# Patient Record
Sex: Female | Born: 1955 | Race: White | Hispanic: No | Marital: Married | Smoking: Never smoker
Health system: Southern US, Community
[De-identification: ages and names within clinical notes are randomized; demographics above are authoritative.]

## PROBLEM LIST (undated history)

## (undated) DIAGNOSIS — D649 Anemia, unspecified: Secondary | ICD-10-CM

## (undated) DIAGNOSIS — I10 Essential (primary) hypertension: Secondary | ICD-10-CM

## (undated) DIAGNOSIS — G2581 Restless legs syndrome: Principal | ICD-10-CM

## (undated) DIAGNOSIS — E538 Deficiency of other specified B group vitamins: Secondary | ICD-10-CM

## (undated) DIAGNOSIS — Z9884 Bariatric surgery status: Secondary | ICD-10-CM

## (undated) DIAGNOSIS — E669 Obesity, unspecified: Secondary | ICD-10-CM

## (undated) DIAGNOSIS — E7800 Pure hypercholesterolemia, unspecified: Secondary | ICD-10-CM

## (undated) DIAGNOSIS — F32A Depression, unspecified: Secondary | ICD-10-CM

## (undated) DIAGNOSIS — F329 Major depressive disorder, single episode, unspecified: Secondary | ICD-10-CM

## (undated) HISTORY — DX: Essential (primary) hypertension: I10

## (undated) HISTORY — DX: Major depressive disorder, single episode, unspecified: F32.9

## (undated) HISTORY — DX: Anemia, unspecified: D64.9

## (undated) HISTORY — DX: Depression, unspecified: F32.A

## (undated) HISTORY — DX: Obesity, unspecified: E66.9

## (undated) HISTORY — PX: ENDOMETRIAL ABLATION: SHX621

## (undated) HISTORY — DX: Pure hypercholesterolemia, unspecified: E78.00

## (undated) HISTORY — DX: Bariatric surgery status: Z98.84

## (undated) HISTORY — DX: Restless legs syndrome: G25.81

## (undated) HISTORY — DX: Deficiency of other specified B group vitamins: E53.8

---

## 1984-03-23 HISTORY — PX: CHOLECYSTECTOMY: SHX55

## 2000-09-06 ENCOUNTER — Encounter: Payer: Self-pay | Admitting: Obstetrics and Gynecology

## 2000-09-06 ENCOUNTER — Ambulatory Visit (HOSPITAL_COMMUNITY): Admission: RE | Admit: 2000-09-06 | Discharge: 2000-09-06 | Payer: Self-pay | Admitting: Obstetrics and Gynecology

## 2000-09-15 ENCOUNTER — Encounter: Payer: Self-pay | Admitting: Obstetrics and Gynecology

## 2000-09-15 ENCOUNTER — Ambulatory Visit (HOSPITAL_COMMUNITY): Admission: RE | Admit: 2000-09-15 | Discharge: 2000-09-15 | Payer: Self-pay | Admitting: *Deleted

## 2000-12-15 ENCOUNTER — Encounter: Payer: Self-pay | Admitting: Obstetrics and Gynecology

## 2000-12-15 ENCOUNTER — Ambulatory Visit (HOSPITAL_COMMUNITY): Admission: RE | Admit: 2000-12-15 | Discharge: 2000-12-15 | Payer: Self-pay | Admitting: Obstetrics and Gynecology

## 2001-10-19 ENCOUNTER — Ambulatory Visit (HOSPITAL_COMMUNITY): Admission: RE | Admit: 2001-10-19 | Discharge: 2001-10-19 | Payer: Self-pay | Admitting: Obstetrics & Gynecology

## 2001-10-19 ENCOUNTER — Encounter: Payer: Self-pay | Admitting: Obstetrics & Gynecology

## 2001-10-26 ENCOUNTER — Ambulatory Visit (HOSPITAL_COMMUNITY): Admission: RE | Admit: 2001-10-26 | Discharge: 2001-10-26 | Payer: Self-pay | Admitting: Obstetrics & Gynecology

## 2001-10-26 ENCOUNTER — Encounter: Payer: Self-pay | Admitting: Obstetrics & Gynecology

## 2002-11-10 ENCOUNTER — Ambulatory Visit (HOSPITAL_COMMUNITY): Admission: RE | Admit: 2002-11-10 | Discharge: 2002-11-10 | Payer: Self-pay | Admitting: Obstetrics & Gynecology

## 2002-11-10 ENCOUNTER — Encounter: Payer: Self-pay | Admitting: Obstetrics & Gynecology

## 2002-11-20 ENCOUNTER — Encounter: Payer: Self-pay | Admitting: Obstetrics & Gynecology

## 2002-11-20 ENCOUNTER — Ambulatory Visit (HOSPITAL_COMMUNITY): Admission: RE | Admit: 2002-11-20 | Discharge: 2002-11-20 | Payer: Self-pay | Admitting: Obstetrics & Gynecology

## 2003-10-03 ENCOUNTER — Other Ambulatory Visit: Admission: RE | Admit: 2003-10-03 | Discharge: 2003-10-03 | Payer: Self-pay | Admitting: Obstetrics and Gynecology

## 2003-12-04 ENCOUNTER — Ambulatory Visit (HOSPITAL_COMMUNITY): Admission: RE | Admit: 2003-12-04 | Discharge: 2003-12-04 | Payer: Self-pay | Admitting: Obstetrics & Gynecology

## 2004-01-09 ENCOUNTER — Encounter (INDEPENDENT_AMBULATORY_CARE_PROVIDER_SITE_OTHER): Payer: Self-pay | Admitting: Specialist

## 2004-01-10 ENCOUNTER — Ambulatory Visit (HOSPITAL_COMMUNITY): Admission: RE | Admit: 2004-01-10 | Discharge: 2004-01-10 | Payer: Self-pay | Admitting: Obstetrics & Gynecology

## 2004-05-29 ENCOUNTER — Ambulatory Visit: Payer: Self-pay | Admitting: Internal Medicine

## 2004-08-08 ENCOUNTER — Ambulatory Visit (HOSPITAL_COMMUNITY): Admission: RE | Admit: 2004-08-08 | Discharge: 2004-08-08 | Payer: Self-pay | Admitting: Family Medicine

## 2005-01-08 ENCOUNTER — Encounter: Admission: RE | Admit: 2005-01-08 | Discharge: 2005-01-08 | Payer: Self-pay | Admitting: Obstetrics & Gynecology

## 2005-01-20 ENCOUNTER — Other Ambulatory Visit: Admission: RE | Admit: 2005-01-20 | Discharge: 2005-01-20 | Payer: Self-pay | Admitting: Obstetrics & Gynecology

## 2005-04-01 ENCOUNTER — Ambulatory Visit (HOSPITAL_COMMUNITY): Admission: RE | Admit: 2005-04-01 | Discharge: 2005-04-01 | Payer: Self-pay | Admitting: Otolaryngology

## 2006-03-23 HISTORY — PX: INCONTINENCE SURGERY: SHX676

## 2006-04-01 ENCOUNTER — Ambulatory Visit (HOSPITAL_COMMUNITY): Admission: RE | Admit: 2006-04-01 | Discharge: 2006-04-01 | Payer: Self-pay | Admitting: Obstetrics & Gynecology

## 2006-07-01 ENCOUNTER — Ambulatory Visit (HOSPITAL_COMMUNITY): Admission: RE | Admit: 2006-07-01 | Discharge: 2006-07-02 | Payer: Self-pay | Admitting: Urology

## 2007-04-07 ENCOUNTER — Ambulatory Visit (HOSPITAL_COMMUNITY): Admission: RE | Admit: 2007-04-07 | Discharge: 2007-04-07 | Payer: Self-pay | Admitting: Obstetrics & Gynecology

## 2008-01-22 HISTORY — PX: GASTRIC BYPASS: SHX52

## 2008-04-18 ENCOUNTER — Ambulatory Visit (HOSPITAL_COMMUNITY): Admission: RE | Admit: 2008-04-18 | Discharge: 2008-04-18 | Payer: Self-pay | Admitting: Internal Medicine

## 2008-04-26 ENCOUNTER — Ambulatory Visit (HOSPITAL_COMMUNITY): Admission: RE | Admit: 2008-04-26 | Discharge: 2008-04-26 | Payer: Self-pay | Admitting: Family Medicine

## 2008-04-26 IMAGING — US US BREAST*R*
1 series · 4 of 4 positions shown · non-contrast
Comparison: Prior studies

CLINICAL DATA: Possible mass right breast identified on recent
screening mammogram.

DIGITAL DIAGNOSTIC  RIGHT  MAMMOGRAM  WITHOUT CAD AND RIGHT BREAST
ULTRASOUND:

[Series 1: us breast*right* · 0.07mm/px · 4 of 4 slices shown]
[im 1/4]
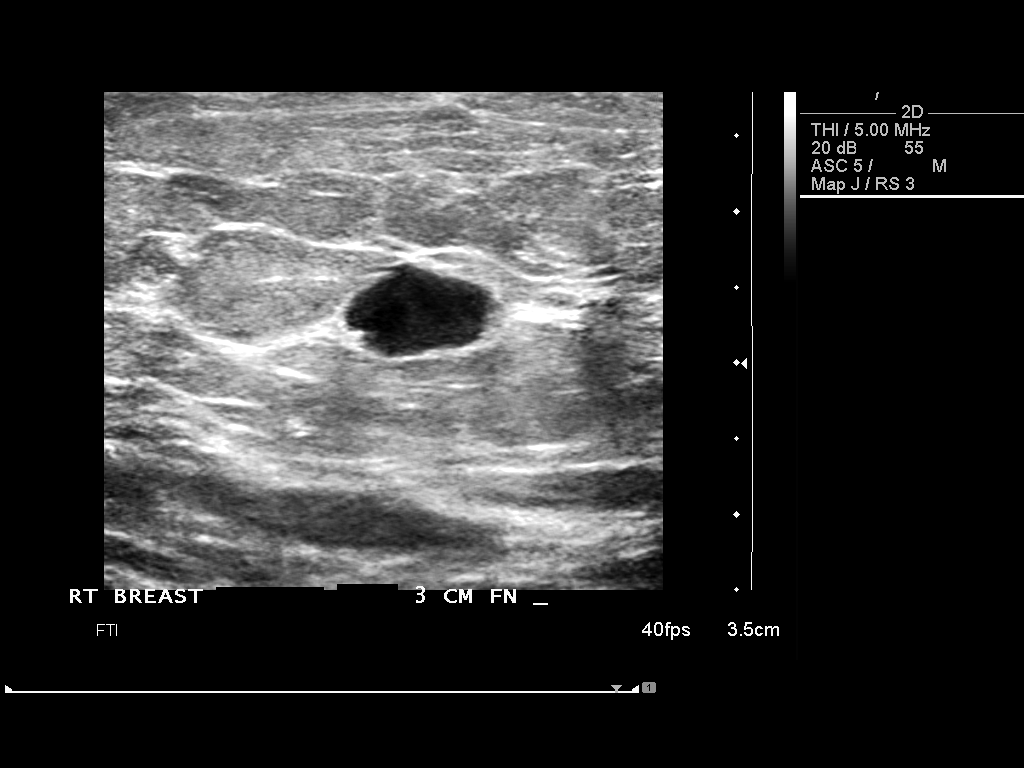
[im 2/4]
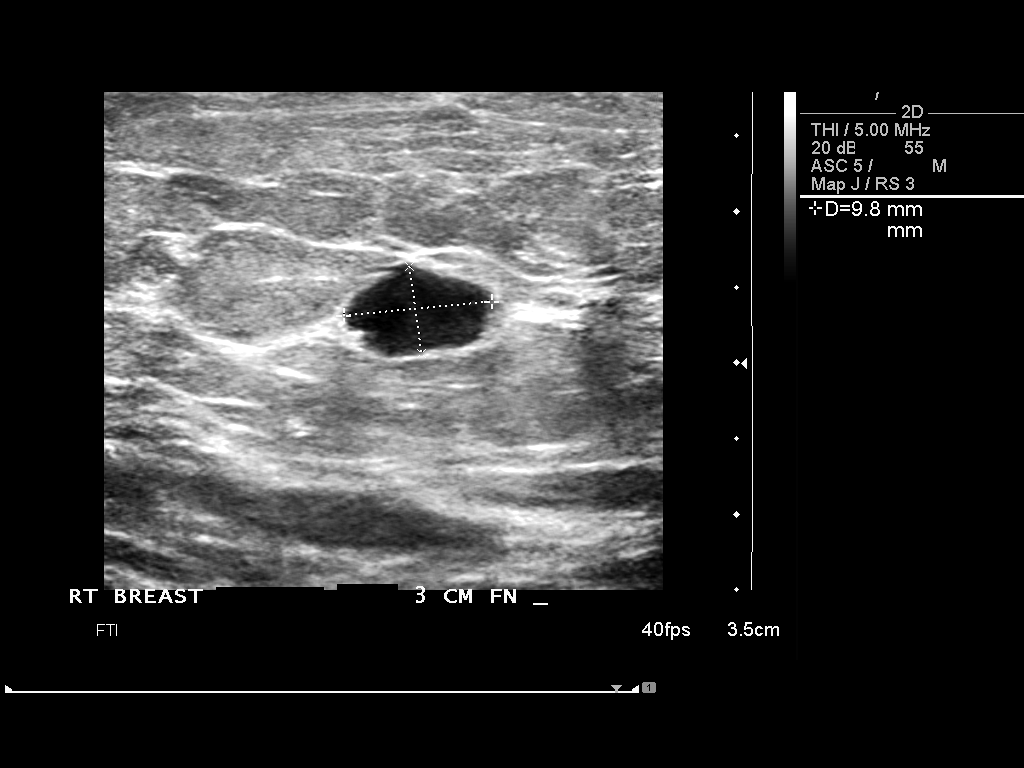
[im 3/4]
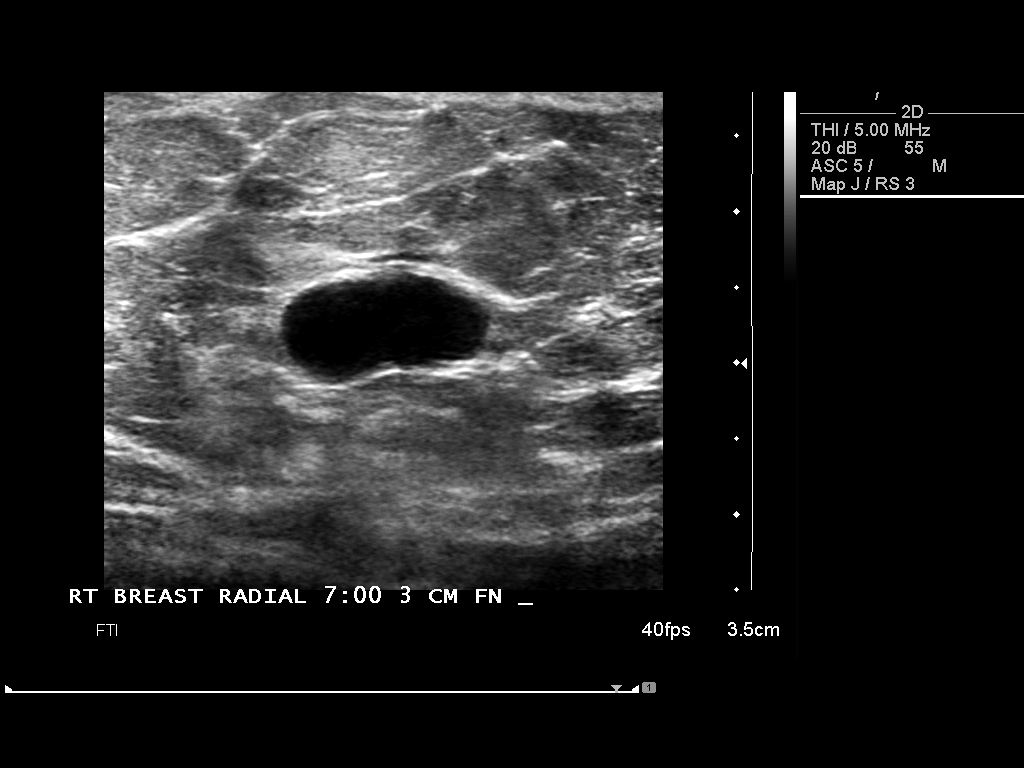
[im 4/4]
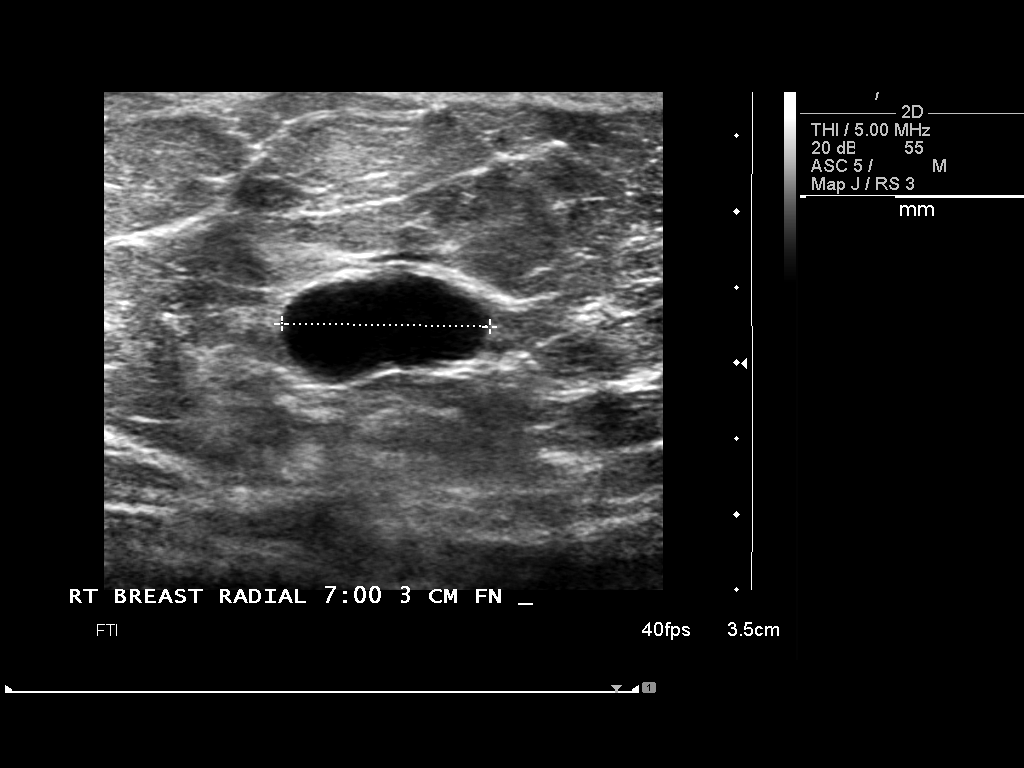

[4 of 4 positions shown; findings below may reference images not displayed]

FINDINGS: Focal spot compression views of the outer right breast,
near the level of the nipple confirm the presence of a
circumscribed oval 14 mm mass.

On physical exam, no mass is palpated in the right breast.

Ultrasound is performed, showing a circumscribed oval anechoic mass
at 7 o'clock position 3 cm from the nipple that measures 14 x 10 x
6 mm.  Imaging findings are consistent with a simple cyst.
IMPRESSION: 14 mm simple cyst 7 o'clock right breast accounts for the mass
identified on the patient's recent screening mammogram.  No
evidence of malignancy is identified in the right breast.
Screening mammogram of both breasts in 1 year is recommended.

BI-RADS CATEGORY 2:  Benign finding(s).

REF:A1 DICTATED: [DATE] [DATE]

## 2009-06-03 ENCOUNTER — Ambulatory Visit (HOSPITAL_COMMUNITY): Admission: RE | Admit: 2009-06-03 | Discharge: 2009-06-03 | Payer: Self-pay | Admitting: Obstetrics & Gynecology

## 2010-08-08 NOTE — Op Note (Signed)
Kimberly [name], [name]               ACCOUNT NO.:  [id number]   MEDICAL RECORD NO.:  [id number]          PATIENT TYPE:  OIB   LOCATION:  1427                         FACILITY:  [facility]   PHYSICIAN:  Sigmund I. Patsi Sears, M.D.DATE OF BIRTH:  1956-01-01   DATE OF PROCEDURE:  07/01/2006  DATE OF DISCHARGE:                               OPERATIVE REPORT   PREOPERATIVE DIAGNOSIS:  Stress urinary incontinence.   POSTOPERATIVE DIAGNOSIS:  Stress urinary incontinence.   PROCEDURE:  Placement of [employer] pubovaginal sling,  cystoscopy.   SURGEON:  Sigmund I. Patsi Sears, M.D.   RESIDENT:  Terie Purser, MD.   ANESTHESIA:  General.   COMPLICATIONS:  None.   BLOOD LOSS:  Minimal.   DRAINS:  16-French Foley catheter.   DISPOSITION:  Stable to post anesthesia care unit.   INDICATIONS FOR PROCEDURE:  Kimberly [name] is a 55 year old G2, P2 female  with a history of worsening stress urinary incontinence.  This was  worsened over the past year.  She currently has a greater than one pad  per day of incontinence and this is quite bothersome for her.  She has  been counseled regarding surgical options and she has agreed to proceed  with a pubovaginal sling after full discussion of benefits and risks.   DESCRIPTION OF PROCEDURE:  The patient was brought to the operating room  appropriately identified.  Time-out performed to confirm correct patient  and procedure.  She was administered general anesthesia given  preoperative antibiotics and placed in the dorsal lithotomy position and  prepped and draped sterile fashion.  We began by placing Foley catheter  and draining the bladder.  0.25% plain Marcaine was used to infiltrate  the anterior vaginal wall at the level of the urethra.  We then made a  approximately 2 cm incision over the mid urethra using a scalpel.  Dissection was carried out laterally through the periurethral tissue  towards the ipsilateral shoulder using the scissors.   Dissection was  carried out to the level of the pubic bone.  The retropubic space was  not entered.  When a adequate pocket was created on both sides of the  urethra, we proceeded with placement of our trocars.  With the bladder  empty, two stab incisions were made at the level of the pubic bone  approximately 2 cm lateral to the midline.  The trocar was placed  through the fascia and walked down the back of the pubic bone onto the  surgeon's finger that was placed in the incision.  The trocar was  delivered through the incision.  This was repeated on the contralateral  side.  The vagina was inspected to ensure that the trocar was not placed  through the corner of the vagina.   At this point we removed the Foley catheter and cystoscopy was  performed.  Indigo carmine had been given.  The bladder and urethra were  inspected with both the 12 and 70 degrees scope.  The urethra was normal  without evidence of perforation or injury.  Indigo carmine was seen  exiting from both ureteral orifices.  There was no cystoscopic evidence  of trocar injury or transmural placement of the trocar.  This was done  with the bladder filled specifically using the 70 degrees lens to  inspect the 11 and 1 o'clock positions.  There was no other evidence of  mucosal abnormality or evidence of bladder tumor noted.  This point the  cystoscope was removed.  Foley catheter was placed and the bladder was  drained.  The sling was then placed using the trocars and delivered.  Tensioning on the sling was done over a right-angle clamp to ensure a  tension-free placement.  The sling was then cut at the skin level.  Copious amounts of antibiotic solution were used.  The vagina was then  closed with a running 3-0 Vicryl suture.  The overlying stab incisions  were closed with Dermabond.  There was a small vaginal mucosal tear on  the right side of the vagina from retraction.  This was closed with a  figure-of-eight 4-0 PDS  suture.  Vaginal packing with Estrace cream was  placed x2.  The patient was then awoken from anesthesia and transported  to recovery room in stable condition.  There were no complications.  Please note Dr. Patsi Sears was present and participated in all aspects  of this procedure.     ______________________________  Terie Purser, MD      Sigmund I. Patsi Sears, M.D.  Electronically Signed    JH/MEDQ  D:  07/01/2006  T:  07/02/2006  Job:  [id number]

## 2010-08-08 NOTE — Op Note (Signed)
NAMESOPHEAP, BASIC               ACCOUNT NO.:  [id number]   MEDICAL RECORD NO.:  [id number]          PATIENT TYPE:  AMB   LOCATION:  SDC                           FACILITY:  [facility]   PHYSICIAN:  Freddy Finner, M.D.   DATE OF BIRTH:  02/08/1956   DATE OF PROCEDURE:  01/10/2004  DATE OF DISCHARGE:                                 OPERATIVE REPORT   PREOPERATIVE DIAGNOSES:  Uterine enlargement, menorrhagia.   POSTOPERATIVE DIAGNOSES:  Uterine enlargement, menorrhagia.   OPERATION/PROCEDURE:  Hysteroscopy, dilatation and curettage and Novasure  endometrial ablation.   SURGEON:  Dr. Jennette Kettle.   ANESTHESIA:  Monitored intravenous sedation, paracervical block.   INTRAOPERATIVE COMPLICATIONS:  None.   ESTIMATED INTRAOPERATIVE BLOOD LOSS:  50 cc or less.   LACTATED RINGER'S DEFICIT FROM PROCEDURE:  100 cc.   The patient was admitted on the morning of surgery.  She received an  antibiotic bolus preoperatively.  She was brought to the operating room,  placed under adequate intravenous sedation, placed in the dorsal lithotomy  position using the [place] stirrup system.  Betadine prep was carried out in  the usual fashion.  The bivalve speculum was introduced, the cervix  visualized, grasped with a single-toothed tenaculum and paracervical block  was then placed using 10 cc of 1% Xylocaine.  Injections were made at 4 and  8 o'clock in the vaginal fornices.  The cervical length was measured at 3  cm.  The uterus was sounded to 11 cm.  The cervix was dilated to 23 with  Pratt's.  The ICMI hysteroscope was placed using lactated Ringer's as the  distending medium.  Examination of the cavity revealed no apparent  abnormality except it was somewhat enlarged.  Gentle curettage was performed  to sample the endometrial tissue.  The Novasure device was applied.  Measurements not noted above were the cavity width of 4.8 cm.  The procedure  was performed in the standard fashion without difficulty.   Re-inspection was  carried out with the hysteroscope to visualize the endometrium.  Adequate  complete ablation was observed.  Photographs were made.  All instruments  were removed.  The patient was awakened and taken to recovery in good  condition.     Hosie Spangle   WRN/MEDQ  D:  01/10/2004  T:  01/10/2004  Job:  [id number]

## 2010-08-12 ENCOUNTER — Other Ambulatory Visit (HOSPITAL_COMMUNITY): Payer: Self-pay | Admitting: Obstetrics & Gynecology

## 2010-08-12 DIAGNOSIS — Z139 Encounter for screening, unspecified: Secondary | ICD-10-CM

## 2010-08-21 ENCOUNTER — Ambulatory Visit (HOSPITAL_COMMUNITY)
Admission: RE | Admit: 2010-08-21 | Discharge: 2010-08-21 | Disposition: A | Discharge status: Home / Self Care | Payer: BC Managed Care – PPO | Source: Ambulatory Visit | Attending: Obstetrics & Gynecology | Admitting: Obstetrics & Gynecology

## 2010-08-21 DIAGNOSIS — Z1231 Encounter for screening mammogram for malignant neoplasm of breast: Secondary | ICD-10-CM | POA: Insufficient documentation

## 2010-08-21 DIAGNOSIS — Z139 Encounter for screening, unspecified: Secondary | ICD-10-CM

## 2011-04-14 ENCOUNTER — Telehealth: Payer: Self-pay

## 2011-04-14 NOTE — Telephone Encounter (Signed)
LMOM to call.

## 2011-04-21 NOTE — Telephone Encounter (Signed)
Called and spoke with pt. She does have some anemia so I scheduled OV with

## 2011-04-21 NOTE — Telephone Encounter (Signed)
OV with Gerrit Halls, NP on 04/29/2011 @ 2:30 PM.

## 2011-04-29 ENCOUNTER — Encounter: Payer: Self-pay | Admitting: Gastroenterology

## 2011-04-29 ENCOUNTER — Ambulatory Visit (INDEPENDENT_AMBULATORY_CARE_PROVIDER_SITE_OTHER): Payer: Managed Care, Other (non HMO) | Admitting: Gastroenterology

## 2011-04-29 DIAGNOSIS — Z1211 Encounter for screening for malignant neoplasm of colon: Secondary | ICD-10-CM | POA: Insufficient documentation

## 2011-04-29 DIAGNOSIS — D649 Anemia, unspecified: Secondary | ICD-10-CM

## 2011-04-29 NOTE — Assessment & Plan Note (Signed)
Self-reported anemia with Hgb 11.1 recently at GYN office. No further labs to review. Will obtain any labs from GYN. Pt would rather wait until she sees bariatric surgeon for further labs. 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 She has no evidence of GI bleeding whatsoever. Discussed with patient possible further work-up in the future if indicated. She states understanding and would rather wait on further bloodwork today.

## 2011-04-29 NOTE — Patient Instructions (Signed)
We have set you up for a colonoscopy in the near future with Dr. Jena Gauss.  Due to possible anemia, we would like to have additional labs. When you go to see your surgeon, we would like to have copies of the labs. If possible, we could order these for you if you find out what is exactly needed. I would like to have a complete blood count, iron, and ferritin level on file. Most likely, this anemia is due to your gastric bypass. We will proceed with further work-up if necessary after the colonoscopy is completed.

## 2011-04-29 NOTE — Assessment & Plan Note (Signed)
56 year old female with need for initial screening colonoscopy. Overall, she has no lower GI symptoms of concern. Due to hx of gastric bypass, will modify prep by doing split dosing and hopefully using Prepopick if insurance costs are appropriate. Discussed ways to complete prep in timely manner without causing discomfort.   Proceed with TCS with Dr. Jena Gauss in near future: the risks, benefits, and alternatives have been discussed with the patient in detail. The patient states understanding and desires to proceed.

## 2011-04-29 NOTE — Progress Notes (Signed)
Referring Provider: Colette Ribas, MD Primary Care Physician:  Colette Ribas, MD, MD Primary Gastroenterologist:  Dr.  Chief Complaint  Patient presents with  . Colonoscopy    HPI:   Ms. Schachter is a pleasant 56 year old female who presents today at the request of Dr. Phillips Odor for a visit prior to initial screening colonoscopy. She has a history of a laparoscopic gastric bypass in 2009 in [place], [place]. She originally was in the 300 lb range, and she has managed to maintain around 100 lb wt loss overall. Prior comorbodities have since resolved since surgery. She states she has been told she was anemic last year during attempts to donate blood. Recently saw GYN who stated Hgb was 11.1 in office. Do not have current labs from GYN to review. Denies abdominal pain, N/V, dysphagia. No melena or hematochezia. Denies constipation. Only has diarrhea with sugary foods, consistent with dumping syndrome. She is due to see Dr. Clent Ridges, bariatric surgeon, in the near future. Comprehensive labs will be done at that time to include CBC, vitamin levels, etc.   At this time, she desires to wait on any further labs with [place] and complete this with the bariatric surgeon.   Past Medical History  Diagnosis Date  . Diabetes mellitus     resolved since gastric bypass in 2009  . Hypertension     resolved since gastric bypass in 2009  . Hypercholesterolemia     resolved since gastric bypass in 2009    Past Surgical History  Procedure Date  . Gastric bypass 01/2008  . Incontinence surgery 2008  . Cholecystectomy 1986  . Endometrial ablation     Current Outpatient Prescriptions  Medication Sig Dispense Refill  . aspirin 81 MG tablet Take 160 mg by mouth daily.      . Biotin 300 MCG TABS Take 3,000 mcg by mouth daily.      Marland Kitchen buPROPion (WELLBUTRIN XL) 300 MG 24 hr tablet Take 300 mg by mouth daily.      . citalopram (CELEXA) 40 MG tablet Take 40 mg by mouth daily.      . ferrous sulfate 325 (65 FE)  MG tablet Take 325 mg by mouth daily with breakfast.      . FIBER PO Take 500 mg by mouth daily.      . fish oil-omega-3 fatty acids 1000 MG capsule Take 2 g by mouth daily.      Marland Kitchen loratadine (CLARITIN) 10 MG tablet Take 10 mg by mouth daily.      . Multiple Vitamin (MULTIVITAMIN) capsule Take 1 capsule by mouth daily.      Marland Kitchen rOPINIRole (REQUIP) 0.5 MG tablet Take 0.5 mg by mouth 3 (three) times daily.      Marland Kitchen VITAMIN D, ERGOCALCIFEROL, PO Take 400 mg by mouth 2 (two) times daily.        Allergies as of 04/29/2011  . (No Known Allergies)    Family History  Problem Relation Age of Onset  . Colon cancer Neg Hx     History   Social History  . Marital Status: Married    Spouse Name: N/A    Number of Children: N/A  . Years of Education: N/A   Occupational History  .  [employer]    make cigarettes   Social History Main Topics  . Smoking status: Never Smoker   . Smokeless tobacco: Not on file  . Alcohol Use: Yes     2 time a month (wine)  .  Drug Use: No  . Sexually Active: Not on file   Other Topics Concern  . Not on file   Social History Narrative  . No narrative on file    Review of Systems: Gen: Denies any fever, chills, loss of appetite, fatigue, weight loss. CV: Denies chest pain, heart palpitations, syncope, peripheral edema. Resp: Denies shortness of breath with rest, cough, wheezing GI: Denies dysphagia or odynophagia. Denies hematemesis, fecal incontinence, or jaundice.  GU : Denies urinary burning, urinary frequency, urinary incontinence.  MS: Denies joint pain, muscle weakness, cramps, limited movement Derm: Denies rash, itching, dry skin Psych: Denies depression, anxiety, confusion or memory loss  Heme: Denies bruising, bleeding, and enlarged lymph nodes.  Physical Exam: BP 128/75  Pulse 75  Temp(Src) 97.8 F (36.6 C) (Temporal)  Ht 5\' 3"  (1.6 m)  Wt 203 lb (92.08 kg)  BMI 35.96 kg/m2 General:   Alert and oriented. Well-developed,  well-nourished, pleasant and cooperative. Head:  Normocephalic and atraumatic. Eyes:  Conjunctiva pink, sclera clear, no icterus.   Conjunctiva pink. Ears:  Normal auditory acuity. Nose:  No deformity, discharge,  or lesions. Mouth:  No deformity or lesions, mucosa pink and moist.  Neck:  Supple, without mass or thyromegaly. Lungs:  Clear to auscultation bilaterally, without wheezing, rales, or rhonchi.  Heart:  S1, S2 present without murmurs noted.  Abdomen:  +BS, soft, non-tender and non-distended. Without mass or HSM. No rebound or guarding. No hernias noted. Rectal:  Deferred  Msk:  Symmetrical without gross deformities. Normal posture. Pulses:  Normal pulses noted. Extremities:  Without clubbing or edema. Neurologic:  Alert and  oriented x4;  grossly normal neurologically. Skin:  Intact, warm and dry without significant lesions or rashes Cervical Nodes:  No significant cervical adenopathy. Psych:  Alert and cooperative. Normal mood and affect.

## 2011-04-30 NOTE — Progress Notes (Signed)
Faxed to PCP

## 2011-05-22 LAB — CBC
MCH: 25.8
MCV: 82.1 fL (ref 78–100)
RBC: 4.42
RDW: 16.1
platelet count: 282

## 2011-05-22 LAB — COMPREHENSIVE METABOLIC PANEL
Albumin: 4
BUN: 16 mg/dL (ref 4–21)
Bilirubin, Direct: 0.1 mg/dL (ref 0.01–0.4)
CO2: 23 mmol/L
Calcium: 8.8 mg/dL
Ferritin: 5 ng/mL — AB (ref 9.0–150.0)
Phosphorus: 4 mg/dL (ref 2.5–4.9)
Sodium: 141 mmol/L (ref 137–147)

## 2011-07-13 ENCOUNTER — Other Ambulatory Visit (HOSPITAL_COMMUNITY): Payer: Self-pay | Admitting: Internal Medicine

## 2011-07-13 ENCOUNTER — Ambulatory Visit (HOSPITAL_COMMUNITY)
Admission: RE | Admit: 2011-07-13 | Discharge: 2011-07-13 | Disposition: A | Discharge status: Home / Self Care | Payer: Managed Care, Other (non HMO) | Source: Ambulatory Visit | Attending: Internal Medicine | Admitting: Internal Medicine

## 2011-07-13 DIAGNOSIS — J069 Acute upper respiratory infection, unspecified: Secondary | ICD-10-CM

## 2011-07-13 DIAGNOSIS — J209 Acute bronchitis, unspecified: Secondary | ICD-10-CM | POA: Insufficient documentation

## 2011-09-01 ENCOUNTER — Other Ambulatory Visit (HOSPITAL_COMMUNITY): Payer: Self-pay | Admitting: Family Medicine

## 2011-09-01 DIAGNOSIS — Z139 Encounter for screening, unspecified: Secondary | ICD-10-CM

## 2011-09-07 ENCOUNTER — Ambulatory Visit (HOSPITAL_COMMUNITY)
Admission: RE | Admit: 2011-09-07 | Discharge: 2011-09-07 | Disposition: A | Discharge status: Home / Self Care | Payer: Managed Care, Other (non HMO) | Source: Ambulatory Visit | Attending: Family Medicine | Admitting: Family Medicine

## 2011-09-07 DIAGNOSIS — Z1231 Encounter for screening mammogram for malignant neoplasm of breast: Secondary | ICD-10-CM | POA: Insufficient documentation

## 2011-09-07 DIAGNOSIS — Z139 Encounter for screening, unspecified: Secondary | ICD-10-CM

## 2011-10-21 ENCOUNTER — Telehealth: Payer: Self-pay | Admitting: *Deleted

## 2011-10-21 NOTE — Telephone Encounter (Signed)
Pt is coming in for an office visit on Monday at 8:00 to see AS to set up her TCS

## 2011-10-21 NOTE — Telephone Encounter (Signed)
Kimberly [name] called today. She was seen several months ago and is wondering if it is time for her to have her colonoscopy. She would like a call back. Thanks.

## 2011-10-26 ENCOUNTER — Ambulatory Visit (INDEPENDENT_AMBULATORY_CARE_PROVIDER_SITE_OTHER): Payer: Managed Care, Other (non HMO) | Admitting: Gastroenterology

## 2011-10-26 ENCOUNTER — Encounter: Payer: Self-pay | Admitting: Gastroenterology

## 2011-10-26 ENCOUNTER — Other Ambulatory Visit: Payer: Self-pay | Admitting: Internal Medicine

## 2011-10-26 VITALS — BP 121/72 | HR 80 | Temp 98.2°F | Ht 63.0 in | Wt 216.2 lb

## 2011-10-26 DIAGNOSIS — Z1211 Encounter for screening for malignant neoplasm of colon: Secondary | ICD-10-CM

## 2011-10-26 DIAGNOSIS — D649 Anemia, unspecified: Secondary | ICD-10-CM

## 2011-10-26 MED ORDER — SOD PICOSULFATE-MAG OX-CIT ACD 10-3.5-12 MG-GM-GM PO PACK: 1.0000 | PACK | Freq: Once | ORAL | Status: DC

## 2011-10-26 NOTE — Patient Instructions (Addendum)
We have set you up for a colonoscopy with Dr. Rourk in the near future.  Further recommendations to follow once this is completed.  

## 2011-10-26 NOTE — Assessment & Plan Note (Signed)
Likely due to hx of malabsorption, s/p gastric bypass in 2009. Placed on iron. Obtain any labs from Dr. Clent Ridges, bariatric surgeon. No melena, hematochezia.

## 2011-10-26 NOTE — Progress Notes (Signed)
Referring Provider: Colette Ribas, MD Primary Care Physician:  Colette Ribas, MD Primary Gastroenterologist: Dr. Jena Gauss   Chief Complaint  Patient presents with  . Colonoscopy    HPI:   Kimberly [name] is a 56 year old female with need for initial screening colonoscopy. She was seen by myself in Feb 2013, but she held off on proceeding due to findings of anemia through her bariatric surgeon. She is s/p laparoscopic gastric bypass in 2009 by Dr. Clent Ridges in [place], [place]. Was pre-op in 300 lb range. Prior comorbodities have resolved.  Denies abdominal pain, N/V, dysphagia. No upper GI symptoms. Denies any rectal bleeding. No changes in bowel habits. No complaints today. Has been placed on iron since last seen.    Past Medical History  Diagnosis Date  . Diabetes mellitus     resolved since gastric bypass in 2009  . Hypertension     resolved since gastric bypass in 2009  . Hypercholesterolemia     resolved since gastric bypass in 2009    Past Surgical History  Procedure Date  . Gastric bypass 01/2008  . Incontinence surgery 2008  . Cholecystectomy 1986  . Endometrial ablation     Current Outpatient Prescriptions  Medication Sig Dispense Refill  . aspirin 81 MG tablet Take 160 mg by mouth daily.      . Biotin 300 MCG TABS Take 3,000 mcg by mouth daily.      Marland Kitchen buPROPion (WELLBUTRIN XL) 300 MG 24 hr tablet Take 300 mg by mouth daily.      . citalopram (CELEXA) 40 MG tablet Take 40 mg by mouth daily.      . ferrous sulfate 325 (65 FE) MG tablet Take 325 mg by mouth daily with breakfast.      . FIBER PO Take 500 mg by mouth daily.      . fish oil-omega-3 fatty acids 1000 MG capsule Take 2 g by mouth daily.      Marland Kitchen loratadine (CLARITIN) 10 MG tablet Take 10 mg by mouth daily.      . meloxicam (MOBIC) 15 MG tablet Take 15 mg by mouth daily.       . Multiple Vitamin (MULTIVITAMIN) capsule Take 1 capsule by mouth daily.      Marland Kitchen rOPINIRole (REQUIP) 0.5 MG tablet Take 0.5 mg by  mouth 3 (three) times daily.      Marland Kitchen VITAMIN D, ERGOCALCIFEROL, PO Take 400 mg by mouth 2 (two) times daily.      . Sod Picosulfate-Mag Ox-Cit Acd 10-3.5-12 MG-GM-GM PACK Take 1 Container by mouth once.  1 each  0    Allergies as of 10/26/2011  . (No Known Allergies)    Family History  Problem Relation Age of Onset  . Colon cancer Neg Hx     History   Social History  . Marital Status: Married    Spouse Name: N/A    Number of Children: N/A  . Years of Education: N/A   Occupational History  .  [employer]    make cigarettes   Social History Main Topics  . Smoking status: Never Smoker   . Smokeless tobacco: None  . Alcohol Use: Yes     2 time a month (wine)  . Drug Use: No  . Sexually Active: None   Other Topics Concern  . None   Social History Narrative  . None    Review of Systems: Gen: Denies fever, chills, anorexia. Denies fatigue, weakness, weight loss.  CV: Denies  chest pain, palpitations, syncope, peripheral edema, and claudication. Resp: Denies dyspnea at rest, cough, wheezing, coughing up blood, and pleurisy. GI: Denies vomiting blood, jaundice, and fecal incontinence.   Denies dysphagia or odynophagia. Derm: Denies rash, itching, dry skin Psych: Denies depression, anxiety, memory loss, confusion. No homicidal or suicidal ideation.  Heme: Denies bruising, bleeding, and enlarged lymph nodes.  Physical Exam: BP 121/72  Pulse 80  Temp 98.2 F (36.8 C) (Temporal)  Ht 5\' 3"  (1.6 m)  Wt 216 lb 3.2 oz (98.068 kg)  BMI 38.30 kg/m2 General:   Alert and oriented. No distress noted. Pleasant and cooperative.  Head:  Normocephalic and atraumatic. Eyes:  Conjuctiva clear without scleral icterus. Mouth:  Oral mucosa pink and moist. Good dentition. No lesions. Neck:  Supple, without mass or thyromegaly. Heart:  S1, S2 present without murmurs, rubs, or gallops. Regular rate and rhythm. Abdomen:  +BS, soft, non-tender and non-distended. No rebound or  guarding. No HSM or masses noted. Msk:  Symmetrical without gross deformities. Normal posture. Extremities:  Without edema. Neurologic:  Alert and  oriented x4;  grossly normal neurologically. Skin:  Intact without significant lesions or rashes. Cervical Nodes:  No significant cervical adenopathy. Psych:  Alert and cooperative. Normal mood and affect.

## 2011-10-26 NOTE — Assessment & Plan Note (Signed)
56 year old female with need for initial average risk screening colonoscopy. No lower or upper GI symptoms, no rectal bleeding.   Proceed with TCS with Dr. Jena Gauss in near future: the risks, benefits, and alternatives have been discussed with the patient in detail. The patient states understanding and desires to proceed. Due to pt's hx of gastric bypass, will order Prepopik for bowel prep. Discussed with pt how to take prep due to hx of procedure.

## 2011-10-26 NOTE — Progress Notes (Signed)
Faxed to PCP

## 2011-11-05 ENCOUNTER — Encounter (HOSPITAL_COMMUNITY): Payer: Self-pay | Admitting: Pharmacy Technician

## 2011-11-18 MED ORDER — SODIUM CHLORIDE 0.45 % IV SOLN
Freq: Once | INTRAVENOUS | Status: AC
  Administered 2011-11-19: 09:00:00 via INTRAVENOUS

## 2011-11-19 ENCOUNTER — Ambulatory Visit (HOSPITAL_COMMUNITY)
Admission: RE | Admit: 2011-11-19 | Discharge: 2011-11-19 | Disposition: A | Discharge status: Home / Self Care | Payer: Managed Care, Other (non HMO) | Source: Ambulatory Visit | Attending: Internal Medicine | Admitting: Internal Medicine

## 2011-11-19 ENCOUNTER — Encounter (HOSPITAL_COMMUNITY): Payer: Self-pay | Admitting: *Deleted

## 2011-11-19 ENCOUNTER — Encounter (HOSPITAL_COMMUNITY)
Admission: RE | Disposition: A | Discharge status: Home / Self Care | Payer: Self-pay | Source: Ambulatory Visit | Attending: Internal Medicine

## 2011-11-19 DIAGNOSIS — Z79899 Other long term (current) drug therapy: Secondary | ICD-10-CM | POA: Insufficient documentation

## 2011-11-19 DIAGNOSIS — Z1211 Encounter for screening for malignant neoplasm of colon: Secondary | ICD-10-CM

## 2011-11-19 DIAGNOSIS — E7800 Pure hypercholesterolemia, unspecified: Secondary | ICD-10-CM | POA: Insufficient documentation

## 2011-11-19 DIAGNOSIS — I10 Essential (primary) hypertension: Secondary | ICD-10-CM | POA: Insufficient documentation

## 2011-11-19 DIAGNOSIS — D126 Benign neoplasm of colon, unspecified: Secondary | ICD-10-CM | POA: Insufficient documentation

## 2011-11-19 DIAGNOSIS — E119 Type 2 diabetes mellitus without complications: Secondary | ICD-10-CM | POA: Insufficient documentation

## 2011-11-19 HISTORY — PX: COLONOSCOPY: SHX5424

## 2011-11-19 SURGERY — COLONOSCOPY
Anesthesia: Moderate Sedation

## 2011-11-19 MED ORDER — MIDAZOLAM HCL 5 MG/5ML IJ SOLN
INTRAMUSCULAR | Status: DC | PRN
  Administered 2011-11-19 (×2): 2 mg via INTRAVENOUS

## 2011-11-19 MED ORDER — MIDAZOLAM HCL 5 MG/5ML IJ SOLN
INTRAMUSCULAR | Status: AC
  Filled 2011-11-19: qty 10

## 2011-11-19 MED ORDER — MEPERIDINE HCL 100 MG/ML IJ SOLN
INTRAMUSCULAR | Status: AC
  Filled 2011-11-19: qty 2

## 2011-11-19 MED ORDER — MEPERIDINE HCL 100 MG/ML IJ SOLN
INTRAMUSCULAR | Status: DC | PRN
  Administered 2011-11-19 (×2): 50 mg via INTRAVENOUS

## 2011-11-19 MED ORDER — STERILE WATER FOR IRRIGATION IR SOLN
Status: DC | PRN
  Administered 2011-11-19: 09:00:00

## 2011-11-19 NOTE — Interval H&P Note (Signed)
History and Physical Interval Note:  11/19/2011 9:13 AM  Kimberly [name]  has presented today for surgery, with the diagnosis of SCREENING TCS  The various methods of treatment have been discussed with the patient and family. After consideration of risks, benefits and other options for treatment, the patient has consented to  Procedure(s) (LRB): COLONOSCOPY (N/A) as a surgical intervention .  The patient's history has been reviewed, patient examined, no change in status, stable for surgery.  I have reviewed the patient's chart and labs.  Questions were answered to the patient's satisfaction.     [name]

## 2011-11-19 NOTE — Op Note (Signed)
[facility] [address] [place], [address]   COLONOSCOPY PROCEDURE REPORT  PATIENT: Kimberly [name], Kimberly [name]  MR#:         [id number] BIRTHDATE: Feb 24, 1956 , 55  yrs. old GENDER: Female ENDOSCOPIST: R.  Roetta Sessions, MD FACP FACG REFERRED BY:  Assunta Found, M.D. PROCEDURE DATE:  11/19/2011 PROCEDURE:     colonoscopy with snare polypectomy  INDICATIONS: first-ever average risk screening colonoscopy  INFORMED CONSENT:  The risks, benefits, alternatives and imponderables including but not limited to bleeding, perforation as well as the possibility of a missed lesion have been reviewed.  The potential for biopsy, lesion removal, etc. have also been discussed.  Questions have been answered.  All parties agreeable. Please see the history and physical in the medical record for more information.  MEDICATIONS: Versed 4 mg IV and Demerol 100 mg IV in divided doses.  DESCRIPTION OF PROCEDURE:  After a digital rectal exam was performed, the EC-[id number] ([id number])  colonoscope was advanced from the anus through the rectum and colon to the area of the cecum, ileocecal valve and appendiceal orifice.  The cecum was deeply intubated.  These structures were well-seen and photographed for the record.  From the level of the cecum and ileocecal valve, the scope was slowly and cautiously withdrawn.  The mucosal surfaces were carefully surveyed utilizing scope tip deflection to facilitate fold flattening as needed.  The scope was pulled down into the rectum where a thorough examination including retroflexion was performed.    FINDINGS:  Marginal to poor prep. Normal rectum. Long redundant colon. 5 mm polyp at the splenic flexure; otherwise, the colon appeared grossly normal.  THERAPEUTIC / DIAGNOSTIC MANEUVERS PERFORMED:  the above-mentioned polyps cold snare removed.  COMPLICATIONS: none  CECAL WITHDRAWAL TIME:  27 minutes  IMPRESSION:  marginal prep. Colonic polyp-removed as  described above  RECOMMENDATIONS: Followup on pathology. Further recommendations to follow.   _______________________________ eSigned:  R. Roetta Sessions, MD FACP [facility] 11/19/2011 10:05 AM   CC:    PATIENT NAME:  Kimberly [name], Kimberly [name] MR#: [id number]

## 2011-11-19 NOTE — H&P (View-Only) (Signed)
 Referring Provider: Golding, John Cabot, MD Primary Care Physician:  GOLDING,JOHN CABOT, MD Primary Gastroenterologist: Dr. Rourk   Chief Complaint  Patient presents with  . Colonoscopy    HPI:   Kimberly [name] is a 55-year-old female with need for initial screening colonoscopy. She was seen by myself in Feb 2013, but she held off on proceeding due to findings of anemia through her bariatric surgeon. She is s/p laparoscopic gastric bypass in 2009 by Dr. Walsh in [place], [place]. Was pre-op in 300 lb range. Prior comorbodities have resolved.  Denies abdominal pain, N/V, dysphagia. No upper GI symptoms. Denies any rectal bleeding. No changes in bowel habits. No complaints today. Has been placed on iron since last seen.    Past Medical History  Diagnosis Date  . Diabetes mellitus     resolved since gastric bypass in 2009  . Hypertension     resolved since gastric bypass in 2009  . Hypercholesterolemia     resolved since gastric bypass in 2009    Past Surgical History  Procedure Date  . Gastric bypass 01/2008  . Incontinence surgery 2008  . Cholecystectomy 1986  . Endometrial ablation     Current Outpatient Prescriptions  Medication Sig Dispense Refill  . aspirin 81 MG tablet Take 160 mg by mouth daily.      . Biotin 300 MCG TABS Take 3,000 mcg by mouth daily.      . buPROPion (WELLBUTRIN XL) 300 MG 24 hr tablet Take 300 mg by mouth daily.      . citalopram (CELEXA) 40 MG tablet Take 40 mg by mouth daily.      . ferrous sulfate 325 (65 FE) MG tablet Take 325 mg by mouth daily with breakfast.      . FIBER PO Take 500 mg by mouth daily.      . fish oil-omega-3 fatty acids 1000 MG capsule Take 2 g by mouth daily.      . loratadine (CLARITIN) 10 MG tablet Take 10 mg by mouth daily.      . meloxicam (MOBIC) 15 MG tablet Take 15 mg by mouth daily.       . Multiple Vitamin (MULTIVITAMIN) capsule Take 1 capsule by mouth daily.      . rOPINIRole (REQUIP) 0.5 MG tablet Take 0.5 mg by  mouth 3 (three) times daily.      . VITAMIN D, ERGOCALCIFEROL, PO Take 400 mg by mouth 2 (two) times daily.      . Sod Picosulfate-Mag Ox-Cit Acd 10-3.5-12 MG-GM-GM PACK Take 1 Container by mouth once.  1 each  0    Allergies as of 10/26/2011  . (No Known Allergies)    Family History  Problem Relation Age of Onset  . Colon cancer Neg Hx     History   Social History  . Marital Status: Married    Spouse Name: N/A    Number of Children: N/A  . Years of Education: N/A   Occupational History  .  [employer]    make cigarettes   Social History Main Topics  . Smoking status: Never Smoker   . Smokeless tobacco: None  . Alcohol Use: Yes     2 time a month (wine)  . Drug Use: No  . Sexually Active: None   Other Topics Concern  . None   Social History Narrative  . None    Review of Systems: Gen: Denies fever, chills, anorexia. Denies fatigue, weakness, weight loss.  CV: Denies   chest pain, palpitations, syncope, peripheral edema, and claudication. Resp: Denies dyspnea at rest, cough, wheezing, coughing up blood, and pleurisy. GI: Denies vomiting blood, jaundice, and fecal incontinence.   Denies dysphagia or odynophagia. Derm: Denies rash, itching, dry skin Psych: Denies depression, anxiety, memory loss, confusion. No homicidal or suicidal ideation.  Heme: Denies bruising, bleeding, and enlarged lymph nodes.  Physical Exam: BP 121/72  Pulse 80  Temp 98.2 F (36.8 C) (Temporal)  Ht 5' 3" (1.6 m)  Wt 216 lb 3.2 oz (98.068 kg)  BMI 38.30 kg/m2 General:   Alert and oriented. No distress noted. Pleasant and cooperative.  Head:  Normocephalic and atraumatic. Eyes:  Conjuctiva clear without scleral icterus. Mouth:  Oral mucosa pink and moist. Good dentition. No lesions. Neck:  Supple, without mass or thyromegaly. Heart:  S1, S2 present without murmurs, rubs, or gallops. Regular rate and rhythm. Abdomen:  +BS, soft, non-tender and non-distended. No rebound or  guarding. No HSM or masses noted. Msk:  Symmetrical without gross deformities. Normal posture. Extremities:  Without edema. Neurologic:  Alert and  oriented x4;  grossly normal neurologically. Skin:  Intact without significant lesions or rashes. Cervical Nodes:  No significant cervical adenopathy. Psych:  Alert and cooperative. Normal mood and affect.  

## 2011-11-22 ENCOUNTER — Encounter: Payer: Self-pay | Admitting: Internal Medicine

## 2011-11-24 ENCOUNTER — Encounter: Payer: Self-pay | Admitting: *Deleted

## 2011-11-27 ENCOUNTER — Other Ambulatory Visit (HOSPITAL_COMMUNITY): Payer: Self-pay | Admitting: Family Medicine

## 2011-11-27 ENCOUNTER — Ambulatory Visit (HOSPITAL_COMMUNITY)
Admission: RE | Admit: 2011-11-27 | Discharge: 2011-11-27 | Disposition: A | Discharge status: Home / Self Care | Payer: Managed Care, Other (non HMO) | Source: Ambulatory Visit | Attending: Family Medicine | Admitting: Family Medicine

## 2011-11-27 DIAGNOSIS — M545 Low back pain, unspecified: Secondary | ICD-10-CM

## 2011-11-27 DIAGNOSIS — S338XXA Sprain of other parts of lumbar spine and pelvis, initial encounter: Secondary | ICD-10-CM

## 2011-11-27 DIAGNOSIS — M533 Sacrococcygeal disorders, not elsewhere classified: Secondary | ICD-10-CM | POA: Insufficient documentation

## 2011-11-30 ENCOUNTER — Encounter (HOSPITAL_COMMUNITY): Payer: Self-pay | Admitting: Internal Medicine

## 2011-12-14 NOTE — Progress Notes (Signed)
Received blood work from [employer], English as a foreign language dated march 2013.   Hgb 11.4 Ferritin 5 Alk Pho 153, otherwise normal LFTs  We need to get updated ferritin and iron, updated CBC, ifobt. Likely IDA secondary to malabsorption (s/p gastric bypass). TCS up-to-date.

## 2011-12-15 ENCOUNTER — Other Ambulatory Visit: Payer: Self-pay

## 2011-12-15 ENCOUNTER — Other Ambulatory Visit: Payer: Self-pay | Admitting: Gastroenterology

## 2011-12-15 DIAGNOSIS — D509 Iron deficiency anemia, unspecified: Secondary | ICD-10-CM

## 2011-12-15 NOTE — Progress Notes (Signed)
Mailed letter, ifobt and lab order to pt.

## 2011-12-21 ENCOUNTER — Encounter (INDEPENDENT_AMBULATORY_CARE_PROVIDER_SITE_OTHER): Payer: Managed Care, Other (non HMO) | Admitting: Ophthalmology

## 2011-12-21 DIAGNOSIS — H2510 Age-related nuclear cataract, unspecified eye: Secondary | ICD-10-CM

## 2011-12-21 DIAGNOSIS — H3540 Unspecified peripheral retinal degeneration: Secondary | ICD-10-CM

## 2011-12-21 DIAGNOSIS — H43819 Vitreous degeneration, unspecified eye: Secondary | ICD-10-CM

## 2012-02-16 ENCOUNTER — Ambulatory Visit (HOSPITAL_COMMUNITY)
Admission: RE | Admit: 2012-02-16 | Discharge: 2012-02-16 | Disposition: A | Discharge status: Home / Self Care | Payer: Managed Care, Other (non HMO) | Source: Ambulatory Visit | Attending: Family Medicine | Admitting: Family Medicine

## 2012-02-16 ENCOUNTER — Other Ambulatory Visit (HOSPITAL_COMMUNITY): Payer: Self-pay | Admitting: Family Medicine

## 2012-02-16 DIAGNOSIS — M25579 Pain in unspecified ankle and joints of unspecified foot: Secondary | ICD-10-CM

## 2012-03-31 ENCOUNTER — Encounter (INDEPENDENT_AMBULATORY_CARE_PROVIDER_SITE_OTHER): Payer: Managed Care, Other (non HMO) | Admitting: Ophthalmology

## 2012-03-31 DIAGNOSIS — H43819 Vitreous degeneration, unspecified eye: Secondary | ICD-10-CM

## 2012-03-31 DIAGNOSIS — H2510 Age-related nuclear cataract, unspecified eye: Secondary | ICD-10-CM

## 2012-09-08 ENCOUNTER — Other Ambulatory Visit (HOSPITAL_COMMUNITY): Payer: Self-pay | Admitting: Obstetrics and Gynecology

## 2012-09-08 DIAGNOSIS — Z139 Encounter for screening, unspecified: Secondary | ICD-10-CM

## 2012-09-13 ENCOUNTER — Ambulatory Visit (HOSPITAL_COMMUNITY): Payer: Managed Care, Other (non HMO)

## 2012-09-15 ENCOUNTER — Ambulatory Visit (HOSPITAL_COMMUNITY)
Admission: RE | Admit: 2012-09-15 | Discharge: 2012-09-15 | Disposition: A | Discharge status: Home / Self Care | Payer: Managed Care, Other (non HMO) | Source: Ambulatory Visit | Attending: Obstetrics and Gynecology | Admitting: Obstetrics and Gynecology

## 2012-09-15 DIAGNOSIS — Z1231 Encounter for screening mammogram for malignant neoplasm of breast: Secondary | ICD-10-CM | POA: Insufficient documentation

## 2012-09-15 DIAGNOSIS — Z139 Encounter for screening, unspecified: Secondary | ICD-10-CM

## 2013-11-09 ENCOUNTER — Other Ambulatory Visit (HOSPITAL_COMMUNITY): Payer: Self-pay | Admitting: Obstetrics & Gynecology

## 2013-11-09 DIAGNOSIS — Z1231 Encounter for screening mammogram for malignant neoplasm of breast: Secondary | ICD-10-CM

## 2013-11-13 ENCOUNTER — Ambulatory Visit (HOSPITAL_COMMUNITY): Payer: Managed Care, Other (non HMO)

## 2013-11-13 ENCOUNTER — Other Ambulatory Visit (HOSPITAL_COMMUNITY): Payer: Self-pay | Admitting: Physician Assistant

## 2013-11-13 DIAGNOSIS — R748 Abnormal levels of other serum enzymes: Secondary | ICD-10-CM

## 2013-11-15 ENCOUNTER — Ambulatory Visit (HOSPITAL_COMMUNITY)
Admission: RE | Admit: 2013-11-15 | Discharge: 2013-11-15 | Disposition: A | Discharge status: Home / Self Care | Payer: Managed Care, Other (non HMO) | Source: Ambulatory Visit | Attending: Physician Assistant | Admitting: Physician Assistant

## 2013-11-15 ENCOUNTER — Ambulatory Visit (HOSPITAL_COMMUNITY)
Admission: RE | Admit: 2013-11-15 | Discharge: 2013-11-15 | Disposition: A | Discharge status: Home / Self Care | Payer: Managed Care, Other (non HMO) | Source: Ambulatory Visit | Attending: Obstetrics & Gynecology | Admitting: Obstetrics & Gynecology

## 2013-11-15 DIAGNOSIS — Z1231 Encounter for screening mammogram for malignant neoplasm of breast: Secondary | ICD-10-CM | POA: Insufficient documentation

## 2013-11-15 DIAGNOSIS — K7689 Other specified diseases of liver: Secondary | ICD-10-CM | POA: Insufficient documentation

## 2013-11-15 DIAGNOSIS — R7989 Other specified abnormal findings of blood chemistry: Secondary | ICD-10-CM | POA: Insufficient documentation

## 2013-11-15 DIAGNOSIS — R160 Hepatomegaly, not elsewhere classified: Secondary | ICD-10-CM | POA: Insufficient documentation

## 2013-11-15 DIAGNOSIS — R748 Abnormal levels of other serum enzymes: Secondary | ICD-10-CM

## 2013-11-16 ENCOUNTER — Ambulatory Visit (HOSPITAL_COMMUNITY): Payer: Managed Care, Other (non HMO)

## 2013-11-20 ENCOUNTER — Other Ambulatory Visit: Payer: Self-pay | Admitting: Obstetrics and Gynecology

## 2013-11-22 LAB — CYTOLOGY - PAP

## 2014-01-19 ENCOUNTER — Encounter: Payer: Self-pay | Admitting: Nutrition

## 2014-01-19 ENCOUNTER — Encounter: Payer: Managed Care, Other (non HMO) | Attending: "Endocrinology | Admitting: Nutrition

## 2014-01-19 DIAGNOSIS — Z9884 Bariatric surgery status: Secondary | ICD-10-CM | POA: Insufficient documentation

## 2014-01-19 DIAGNOSIS — Z6841 Body Mass Index (BMI) 40.0 and over, adult: Secondary | ICD-10-CM | POA: Diagnosis not present

## 2014-01-19 DIAGNOSIS — Z713 Dietary counseling and surveillance: Secondary | ICD-10-CM | POA: Diagnosis not present

## 2014-01-19 DIAGNOSIS — E669 Obesity, unspecified: Secondary | ICD-10-CM | POA: Diagnosis not present

## 2014-01-19 NOTE — Patient Instructions (Addendum)
Plan:   1. Start using a walking tape for exercise. 2. Cut out soft drinks. 3. Plan better for meals and appropriate foods. 4. Increase water intake to 64 oz per day. 5. Increase protein with all meals and reduce carb intake of starchy vegetables . Increase protein to include PB, eggs, cheese or  [place] yogurt.     Try using protein powers in breakfast shakes 6. Switch dressing from [facility] to a vinaigrette 7. Lose 1 lb per week. 8.Keep a food journal.

## 2014-01-19 NOTE — Progress Notes (Signed)
  Medical Nutrition Therapy:  Appt start time: 1300 end time:  1400.   Assessment:  Primary concerns today: Obesity.Marland Kitchen  6 yrs ago gastric bypass in 2009. Lost 110 lbs 2009 and now is gaining some of it back.  Works full time. LIves with spouse. She does shopping and cooking.. Most foods are baked. Eats out once a week. Physical activity : golfs some but not weekly.  Has 56 yr old son recently moved back home. Has had a lot of stress with son in rehab and brother recently died of lung cancer who was her younger brother.  Admits to a lot of family stress and emotional eating.  Admits to not planning as she use to for her meals and physical activity.  Preferred Learning Style:     No preference indicated   Learning Readiness:   Ready  Change in progress   MEDICATIONS: Nutritional couseling for weight loss.   DIETARY INTAKE:  24-hr recall:  B ( AM): Fiber bar, 1/2 cam Sund[place] OR banana  Snk ( AM): Diet soda, apple L ( PM): Meat, vegetables, (homemade chicken noodle soup), Crackers 8-9 OR Grilled chicken sandwich with pasta salad. Sweet tea Snk ( PM): none D ( PM): Strip steak with toss salad-Ranch Dressing, 1/2 can cherry coke, m & m's, OR cheeseburger, fries and gingerale,  Snk ( PM) ice cream sandwich and hard pretzels.:  Beverages: Diet soda, regular sodas, water and sweet tea  Food journal reveals she is eating a lot of empty calorie sweets and beverages and salty snacks.   Usual physical activity: ADL's  Estimated energy needs: 1200- 1500 calories 170 g carbohydrates 112 g protein 42 g fat  Progress Towards Goal(s):  In progress.   Nutritional Diagnosis:  NB-1.1 Food and nutrition-related knowledge deficit As related to Obesity.  As evidenced by BMI > 30. and regaining 30-40 lbs from her gastric bypass.    Intervention:  Nutrition counseling for weight loss and improved health; s/p gastric bypass from 2009.Marland Kitchen  Plan:   1. Start using a DVD walking tape for exercise 30  minutes a day. 2. Cut out soft drinks. 3. Plan better for meals and appropriate low carb and high protein foods for meals. 4. Increase water intake to 64 oz per day. 5. Increase protein with all meals and reduce carb intake of starchy vegetables . 6. Switch dressing from [facility] to a vinaigrette 7. Lose 1 lb per week.   Teaching Method Utilized:  Visual Auditory Hands on  Handouts given during visit include:  3 months post gastric bypass handouts.  Meal Planning Card  Barriers to learning/adherence to lifestyle change: none  Demonstrated degree of understanding via:  Teach Back   Monitoring/Evaluation:  Dietary intake, exercise, meal planning, and body weight in 1 month.

## 2014-02-23 ENCOUNTER — Ambulatory Visit: Payer: Managed Care, Other (non HMO) | Admitting: Nutrition

## 2014-04-19 ENCOUNTER — Telehealth: Payer: Self-pay | Admitting: Neurology

## 2014-04-19 NOTE — Telephone Encounter (Signed)
I called pt because we had a cancellation on 05-02-14 so we resch from 05-18-14 notified the referring dr of change

## 2014-05-02 ENCOUNTER — Ambulatory Visit (INDEPENDENT_AMBULATORY_CARE_PROVIDER_SITE_OTHER): Payer: Managed Care, Other (non HMO) | Admitting: Neurology

## 2014-05-02 ENCOUNTER — Encounter: Payer: Self-pay | Admitting: Neurology

## 2014-05-02 VITALS — BP 130/80 | HR 68 | Ht 63.0 in | Wt 255.5 lb

## 2014-05-02 DIAGNOSIS — D509 Iron deficiency anemia, unspecified: Secondary | ICD-10-CM

## 2014-05-02 DIAGNOSIS — G2581 Restless legs syndrome: Secondary | ICD-10-CM

## 2014-05-02 HISTORY — DX: Restless legs syndrome: G25.81

## 2014-05-02 MED ORDER — ROPINIROLE HCL 2 MG PO TABS: ORAL_TABLET | ORAL | Status: DC

## 2014-05-02 NOTE — Patient Instructions (Addendum)
1.  Recommend taking ropinirole 1mg  at noon and 1mg  at 7pm.  If nighttime symptoms are worse at this dose, go back to taking 2mg  at 7pm.  We may consider adding extended release in the future 2.  Agree with hematology evaluation to optimize iron deficient anemia 3.  Please send me a MyChart message in 2 weeks with an update 4.  Return to clinic 75-months

## 2014-05-02 NOTE — Progress Notes (Signed)
[facility] Clinic Note - Initial Visit   Date: 05/02/2014   Kimberly [name] MRN: [id number] DOB: Mar 12, 1956   Dear Collene Mares, PA:   Thank you for your kind referral of Kimberly [name] for consultation of restless leg syndrome. Although her history is well known to you, please allow [place] to reiterate it for the purpose of our medical record. The patient was accompanied to the clinic by self.   History of Present Illness: Kimberly [name] is a 59 y.o. right-handed Caucasian female with history of diabetes mellitus, hypertension, and hyperlipidemia which all resolved since gastric bypass in 2009, iron deficient anemia, osteoarthritis, and depression presenting for evaluation of restless leg syndrome.    Starting in the early 2000s, she developed restless sensation of her legs especially at night.  She recalls being unable to get comfortable when she was riding for long distances or resting. She describes is as if "legs have claustrophobia".  It is always improved by standing or walking.  When she is traveling, she always tries to get an aisle seat.  She was started on ropinirole 1mg  which alleviated symptoms for many years.  In the summer of 2015, she started having worsening symptoms which would wake her up at night time but also daytime symptoms.  In August 2015, ropinirole was increased to 2mg  at bedtime which helps her night time RLS, but does not have any benefit with daytime symptoms.  She reports having mild nausea and sleepiness with the 2mg  dose, which she takes 7pm.  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   She has known history of iron deficient anemia with ferritin of 9 and has been taking iron supplements without any improvement, for which she is scheduled to see hematology next week.   Past Medical  History  Diagnosis Date  . Diabetes mellitus     resolved since gastric bypass in 2009  . Hypertension     resolved since gastric bypass in 2009  . Hypercholesterolemia     resolved since gastric bypass in 2009  . Obesity     Past Surgical History  Procedure Laterality Date  . Gastric bypass  01/2008  . Incontinence surgery  2008  . Cholecystectomy  1986  . Endometrial ablation    . Colonoscopy  11/19/2011    Procedure: COLONOSCOPY;  Surgeon: Daneil Dolin, MD;  Location: [facility];  Service: Endoscopy;  Laterality: N/A;  9:30     Medications:  Current Outpatient Prescriptions on File Prior to Visit  Medication Sig Dispense Refill  . acetaminophen (TYLENOL) 500 MG tablet Take 1,000 mg by mouth every 6 (six) hours as needed. For pain    . Biotin 5000 MCG TABS Take 5,000 mcg by mouth daily.    Marland Kitchen buPROPion (WELLBUTRIN XL) 300 MG 24 hr tablet Take 300 mg by mouth daily.    . Calcium Carbonate-Vit D-Min (CALCIUM 1200 PO) Take 2,400 mg by mouth 2 (two) times daily.    . celecoxib (CELEBREX) 200 MG capsule Take 200 mg by mouth 2 (two) times daily.    . Cholecalciferol (VITAMIN D3) 10000 UNITS capsule Take 10,000 Units by mouth 2 (two) times daily.    . citalopram (CELEXA) 40 MG tablet Take 40 mg by mouth daily.    Marland Kitchen FeFum-FePoly-FA-B Cmp-C-Biot (FOLIVANE-PLUS) CAPS Take  1 capsule by mouth daily.    Marland Kitchen loratadine (CLARITIN) 10 MG tablet Take 10 mg by mouth daily.    . Methylcellulose, Laxative, (FIBER THERAPY) 500 MG TABS Take 500 mg by mouth daily.    . Multiple Vitamin (MULTIVITAMIN WITH MINERALS) TABS Take 1 tablet by mouth daily.     No current facility-administered medications on file prior to visit.    Allergies:  Allergies  Allergen Reactions  . Kiwi Extract Swelling and Rash    Family History: Family History  Problem Relation Age of Onset  . Colon cancer Neg Hx     Social History: History   Social History  . Marital Status: Married    Spouse Name: N/A  .  Number of Children: N/A  . Years of Education: N/A   Occupational History  .  [employer]    make cigarettes   Social History Main Topics  . Smoking status: Never Smoker   . Smokeless tobacco: Not on file  . Alcohol Use: Yes     Comment: 2 time a month (wine)  . Drug Use: No  . Sexual Activity: Not on file   Other Topics Concern  . Not on file   Social History Narrative    Review of Systems:  CONSTITUTIONAL: No fevers, chills, night sweats, or weight loss.   EYES: No visual changes or eye pain ENT: No hearing changes.  No history of nose bleeds.   RESPIRATORY: No cough, wheezing and shortness of breath.   CARDIOVASCULAR: Negative for chest pain, and palpitations.   GI: Negative for abdominal discomfort, blood in stools or black stools.  No recent change in bowel habits.   GU:  No history of incontinence.   MUSCLOSKELETAL: No history of joint pain or swelling.  No myalgias.   SKIN: Negative for lesions, rash, and itching.   HEMATOLOGY/ONCOLOGY: Negative for prolonged bleeding, bruising easily, and swollen nodes.  No history of cancer.   ENDOCRINE: Negative for cold or heat intolerance, polydipsia or goiter.   PSYCH:  +depression or anxiety symptoms.   NEURO: As Above.   Vital Signs:  BP 130/80 mmHg  Pulse 68  Ht 5\' 3"  (1.6 m)  Wt 255 lb 8 oz (115.894 kg)  BMI 45.27 kg/m2  SpO2 99%   General Medical Exam:   General:  Well appearing, comfortable.   Eyes/ENT: see cranial nerve examination.   Neck: No masses appreciated.  Full range of motion without tenderness.  No carotid bruits. Respiratory:  Clear to auscultation, good air entry bilaterally.   Cardiac:  Regular rate and rhythm, no murmur.   Extremities:  No deformities, edema, or skin discoloration.  Skin:  No rashes or lesions.  Neurological Exam: MENTAL STATUS including orientation to time, place, person, recent and remote memory, attention span and concentration, language, and fund of knowledge is  normal.  Speech is not dysarthric.  CRANIAL NERVES: II:  No visual field defects.  Unremarkable fundi.   III-IV-VI: Pupils equal round and reactive to light.  Normal conjugate, extra-ocular eye movements in all directions of gaze.  No nystagmus.  No ptosis.   V:  Normal facial sensation.     VII:  Normal facial symmetry and movements.   VIII:  Normal hearing and vestibular function.   IX-X:  Normal palatal movement.   XI:  Normal shoulder shrug and head rotation.   XII:  Normal tongue strength and range of motion, no deviation or fasciculation.  MOTOR:  No atrophy, fasciculations or abnormal  movements.  No pronator drift.  Tone is normal.    Right Upper Extremity:    Left Upper Extremity:    Deltoid  5/5   Deltoid  5/5   Biceps  5/5   Biceps  5/5   Triceps  5/5   Triceps  5/5   Wrist extensors  5/5   Wrist extensors  5/5   Wrist flexors  5/5   Wrist flexors  5/5   Finger extensors  5/5   Finger extensors  5/5   Finger flexors  5/5   Finger flexors  5/5   Dorsal interossei  5/5   Dorsal interossei  5/5   Abductor pollicis  5/5   Abductor pollicis  5/5   Tone (Ashworth scale)  0  Tone (Ashworth scale)  0   Right Lower Extremity:    Left Lower Extremity:    Hip flexors  5/5   Hip flexors  5/5   Hip extensors  5/5   Hip extensors  5/5   Knee flexors  5/5   Knee flexors  5/5   Knee extensors  5/5   Knee extensors  5/5   Dorsiflexors  5/5   Dorsiflexors  5/5   Plantarflexors  5/5   Plantarflexors  5/5   Toe extensors  5/5   Toe extensors  5/5   Toe flexors  5/5   Toe flexors  5/5   Tone (Ashworth scale)  0  Tone (Ashworth scale)  0   MSRs:  Right                                                                 Left brachioradialis 2+  brachioradialis 2+  biceps 2+  biceps 2+  triceps 2+  triceps 2+  patellar 2+  patellar 2+  ankle jerk 2+  ankle jerk 2+  Hoffman no  Hoffman no  plantar response down  plantar response down   SENSORY:  Normal and symmetric perception of light  touch, pinprick, vibration, and proprioception.  Romberg's sign absent.   COORDINATION/GAIT: Normal finger-to- nose-finger and heel-to-shin.  Intact rapid alternating movements bilaterally.  Able to rise from a chair without using arms.  Gait narrow based and stable. Tandem and stressed gait intact.    IMPRESSION: Mrs. Solberg is a 59 year-old female with iron deficient anemia presenting for evaluation of restless leg syndrome.  She was well-controlled on ropinirole 1mg  for 15 years and then in the summer of 2015 started having worsening nighttime symptoms and new onset of daytime discomfort which is now interfering with her quality of life.  She had severe iron deficient anemia and despite taking iron supplements, her ferritin remains low.  I encouraged her to see hematology for evaluation since low ferritin levels will most definitely make RLS worse.    In the meantime, I will optimize her ropinirole to provide her greater relief.  Recommend taking ropinirole 1mg  at noon and 1mg  at 7pm.  If nighttime symptoms are worse at this dose, she should go back to taking 2mg  at 7pm.   Going forward, may consider switching to extended release formulation which may help with her nighttime nausea  Instructed patient to send me a MyChart message in 2 weeks with an update  I will see  her back in 63-months   The duration of this appointment visit was 45 minutes of face-to-face time with the patient.  Greater than 50% of this time was spent in counseling, explanation of diagnosis, planning of further management, and coordination of care.   Thank you for allowing me to participate in patient's care.  If I can answer any additional questions, I would be pleased to do so.    Sincerely,    Donika K. Posey Pronto, DO

## 2014-05-02 NOTE — Progress Notes (Signed)
Note faxed.

## 2014-05-07 ENCOUNTER — Encounter (HOSPITAL_BASED_OUTPATIENT_CLINIC_OR_DEPARTMENT_OTHER): Payer: Managed Care, Other (non HMO)

## 2014-05-07 ENCOUNTER — Encounter (HOSPITAL_COMMUNITY): Payer: Self-pay | Admitting: Hematology & Oncology

## 2014-05-07 ENCOUNTER — Encounter (HOSPITAL_COMMUNITY): Payer: Managed Care, Other (non HMO) | Attending: Hematology & Oncology | Admitting: Hematology & Oncology

## 2014-05-07 VITALS — BP 130/61 | HR 71 | Temp 98.4°F | Resp 18 | Ht 63.0 in | Wt 258.9 lb

## 2014-05-07 DIAGNOSIS — Z9884 Bariatric surgery status: Secondary | ICD-10-CM

## 2014-05-07 DIAGNOSIS — D509 Iron deficiency anemia, unspecified: Secondary | ICD-10-CM | POA: Insufficient documentation

## 2014-05-07 MED ORDER — SODIUM CHLORIDE 0.9 % IV SOLN
510.0000 mg | Freq: Once | INTRAVENOUS | Status: AC
  Administered 2014-05-07: 510 mg via INTRAVENOUS
  Filled 2014-05-07: qty 17

## 2014-05-07 MED ORDER — SODIUM CHLORIDE 0.9 % IV SOLN
Freq: Once | INTRAVENOUS | Status: AC
  Administered 2014-05-07: 14:00:00 via INTRAVENOUS

## 2014-05-07 NOTE — Patient Instructions (Signed)
..  [place] at [facility] Discharge Instructions  RECOMMENDATIONS MADE BY THE CONSULTANT AND ANY TEST RESULTS WILL BE SENT TO YOUR REFERRING PHYSICIAN.  We checked your b12 level today and we will give you 6 doses of feraheme (one a week) We will see you back in 1 month with labs and to see the Dr.   Lujean Rave you for choosing [place] at [facility] to provide your oncology and hematology care.  To afford each patient quality time with our provider, please arrive at least 15 minutes before your scheduled appointment time.    You need to re-schedule your appointment should you arrive 10 or more minutes late.  We strive to give you quality time with our providers, and arriving late affects you and other patients whose appointments are after yours.  Also, if you no show three or more times for appointments you may be dismissed from the clinic at the providers discretion.     Again, thank you for choosing [facility].  Our hope is that these requests will decrease the amount of time that you wait before being seen by our physicians.       _____________________________________________________________  Should you have questions after your visit to [facility], please contact our office at [phone] between the hours of 8:30 a.m. and 4:30 p.m.  Voicemails left after 4:30 p.m. will not be returned until the following business day.  For prescription refill requests, have your pharmacy contact our office.

## 2014-05-07 NOTE — Progress Notes (Signed)
Tolerated iron infusion well. 

## 2014-05-07 NOTE — Progress Notes (Signed)
[facility] CONSULT NOTE  Patient Care Team: Sharilyn Sites, MD as PCP - General (Family Medicine) Daneil Dolin, MD (Gastroenterology)  CHIEF COMPLAINTS/PURPOSE OF CONSULTATION:  Iron deficiency anemia Gastric bypass  Colonoscopy 10/2011 with colon polyp, marginal prep  HISTORY OF PRESENTING ILLNESS:  Kimberly [name] 59 y.o. female is here because of iron deficiency anemia. She reports developing restless leg syndrome several years ago. She has noted that since the fall of 2015 her symptoms have significantly worsened. She wakes up a lot during the night and now her legs bother her during the day. She had a gastric bypass in 2009. She notes that after the procedure she still donated blood for several years. 2 years ago she was advised she could no longer donate blood because of anemia.  After her restless leg syndrome worsened she went to [place] for evaluation and states she was told she was iron deficient. She was started on iron tablets, but in January her iron level was unchanged. 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  Laboratory studies from 04/11/2014 show a hemoglobin of 9.6, MCV of 73, RDW elevated at 18.8, serum ferritin of 6 ng/ml, and iron saturation at 7%.   MEDICAL HISTORY:  Past Medical History  Diagnosis Date  . Diabetes mellitus     resolved since gastric bypass in 2009  . Hypertension     resolved since gastric bypass in 2009  . Hypercholesterolemia     resolved since gastric bypass in 2009  . Obesity   . Depression   . RLS (restless legs syndrome) 05/02/2014  . Anemia     SURGICAL HISTORY: Past Surgical History  Procedure Laterality Date  . Gastric bypass  01/2008  . Incontinence surgery  2008  . Cholecystectomy  1986  . Endometrial ablation    . Colonoscopy  11/19/2011    Procedure: COLONOSCOPY;  Surgeon: Daneil Dolin, MD;  [facility];  Service: Endoscopy;  Laterality: N/A;  9:30    SOCIAL  HISTORY: History   Social History  . Marital Status: Married    Spouse Name: N/A  . Number of Children: N/A  . Years of Education: N/A   Occupational History  .  [employer]    make cigarettes   Social History Main Topics  . Smoking status: Never Smoker   . Smokeless tobacco: Not on file  . Alcohol Use: 0.0 oz/week    0 Standard drinks or equivalent per week     Comment: 2 time a month (wine)  . Drug Use: No  . Sexual Activity: Not on file   Other Topics Concern  . Not on file   Social History Narrative   Lives with husband in a 2 story home.  Has 2 sons.     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     Education: MBA    FAMILY HISTORY: Family History  Problem Relation Age of Onset  . Colon cancer Neg Hx   . Restless legs syndrome Father   . Seizures Father     due to meningiomas  . Myasthenia gravis Father     Living  . Lung cancer Brother     Deceased, 49  . Healthy Sister   . Healthy Son     x2   indicated that her mother is alive. She indicated that her father is alive. She indicated that her sister is alive.  She indicated that her brother is deceased.   Brother died at the age of 32 from lung cancer he was a smoker. Her parents are in their late 42s and described as healthy.  ALLERGIES:  is allergic to kiwi extract.  MEDICATIONS:  Current Outpatient Prescriptions  Medication Sig Dispense Refill  . acetaminophen (TYLENOL) 500 MG tablet Take 1,000 mg by mouth every 6 (six) hours as needed. For pain    . Biotin 5000 MCG TABS Take 5,000 mcg by mouth daily.    Marland Kitchen buPROPion (WELLBUTRIN XL) 300 MG 24 hr tablet Take 300 mg by mouth daily.    . Calcium Carbonate-Vit D-Min (CALCIUM 1200 PO) Take 2,400 mg by mouth 2 (two) times daily.    . celecoxib (CELEBREX) 200 MG capsule Take 200 mg by mouth daily.     . Cholecalciferol (VITAMIN D3) 10000 UNITS capsule Take 10,000 Units by mouth 2 (two) times daily.    .  citalopram (CELEXA) 40 MG tablet Take 40 mg by mouth daily.    Marland Kitchen FeFum-FePoly-FA-B Cmp-C-Biot (FOLIVANE-PLUS) CAPS Take 1 capsule by mouth daily.    . Multiple Vitamin (MULTIVITAMIN WITH MINERALS) TABS Take 1 tablet by mouth daily.    Marland Kitchen rOPINIRole (REQUIP) 2 MG tablet Take 0.5 tab at noon and 1 tab at 7pm. 135 tablet 3  . loratadine (CLARITIN) 10 MG tablet Take 10 mg by mouth daily.    . Methylcellulose, Laxative, (FIBER THERAPY) 500 MG TABS Take 500 mg by mouth daily.     No current facility-administered medications for this visit.    Review of Systems  Constitutional: Positive for malaise/fatigue.  HENT:       Notes history of migraine  Eyes: Negative.   Respiratory: Negative.   Cardiovascular: Negative.   Gastrointestinal: Negative.   Genitourinary: Negative.   Musculoskeletal: Positive for joint pain.  Skin: Negative.   Neurological: Positive for headaches.       Severe RLS  Endo/Heme/Allergies: Negative.   Psychiatric/Behavioral: The patient has insomnia.     PHYSICAL EXAMINATION: ECOG PERFORMANCE STATUS: 1 - Symptomatic but completely ambulatory  Filed Vitals:   05/07/14 1500  BP: 130/61  Pulse: 71  Temp:   Resp: 18   Filed Weights   05/07/14 1317  Weight: 258 lb 14.4 oz (117.436 kg)     Physical Exam  Constitutional: She is oriented to person, place, and time and well-developed, well-nourished, and in no distress.  obese  HENT:  Head: Normocephalic and atraumatic.  Nose: Nose normal.  Mouth/Throat: Oropharynx is clear and moist. No oropharyngeal exudate.  Eyes: Conjunctivae and EOM are normal. Pupils are equal, round, and reactive to light. Right eye exhibits no discharge. Left eye exhibits no discharge. No scleral icterus.  Neck: Normal range of motion. Neck supple. No tracheal deviation present. No thyromegaly present.  Cardiovascular: Normal rate, regular rhythm and normal heart sounds.  Exam reveals no gallop and no friction rub.   No murmur  heard. Pulmonary/Chest: Effort normal and breath sounds normal. She has no wheezes. She has no rales.  Abdominal: Soft. Bowel sounds are normal. She exhibits no distension and no mass. There is no tenderness. There is no rebound and no guarding.  Musculoskeletal: Normal range of motion. She exhibits no edema.  Lymphadenopathy:    She has no cervical adenopathy.  Neurological: She is alert and oriented to person, place, and time. She has normal reflexes. No cranial nerve deficit. Gait normal. Coordination normal.  Skin: Skin is warm and dry. No  rash noted.  Psychiatric: Mood, memory, affect and judgment normal.  Nursing note and vitals reviewed.    LABORATORY DATA:  I have reviewed the data as listed Lab Results  Component Value Date   WBC 5.2 05/22/2011   HGB 11.4* 05/22/2011   HCT 36 05/22/2011     Chemistry      Component Value Date/Time   NA 141 05/22/2011 1655   K 4.1 05/22/2011 1655   CO2 23 05/22/2011 1655   BUN 16 05/22/2011 1655   CREATININE 0.58 05/22/2011 1655      Component Value Date/Time   CALCIUM 8.8 05/22/2011 1655   ALKPHOS 153 05/22/2011 1655   AST 22 05/22/2011 1655   ALT 18 05/22/2011 1655   BILITOT 0.3 05/22/2011 1655        ASSESSMENT & PLAN:  Iron deficiency anemia Pleasant 59 year old female with iron deficiency anemia. 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 We have calculated her total iron deficit today and we will set her up to begin weekly IV Feraheme. I will see her back in 4 weeks to review her CBC and iron levels.  I advised her that her initial iron placement replacement will take several weeks and the initial iron she receives will go towards making blood, then additional iron will go towards restoring her iron stores. I have advised her that if her blood counts do not completely normalize after her iron stores are  replete we will look at other causes of anemia. We may need to consider a GI evaluation as well. Her last colonoscopy was in 2013 but a marginal prep was noted. Given her history of gastric bypass I have also recommended adding a B12 level to her next set of labs.   We discussed some of the risks, benefits, and alternatives of intravenous iron infusions. The patient is symptomatic from anemia and the iron level is critically low. Some of the side-effects to be expected including risks of infusion reactions, phlebitis, headaches, nausea and fatigue.  The patient is willing to proceed. Goal is to keep ferritin level greater than or equal to 100 ng/ml      Orders Placed This Encounter  Procedures  . Vitamin B12  . CBC with Differential    Standing Status: Future     Number of Occurrences:      Standing Expiration Date: 05/08/2015  . Reticulocytes    Standing Status: Future     Number of Occurrences:      Standing Expiration Date: 05/08/2015  . Ferritin    Standing Status: Future     Number of Occurrences:      Standing Expiration Date: 05/08/2015  . Iron and TIBC    Standing Status: Future     Number of Occurrences:      Standing Expiration Date: 05/08/2015    All questions were answered. The patient knows to call the clinic with any problems, questions or concerns. This note was electronically signed.    Molli Hazard, MD MD 05/08/2014 8:08 AM

## 2014-05-08 LAB — VITAMIN B12: Vitamin B-12: 676 pg/mL (ref 211–911)

## 2014-05-08 NOTE — Assessment & Plan Note (Signed)
Pleasant 59 year old female with iron deficiency anemia. 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 We have calculated her total iron deficit today and we will set her up to begin weekly IV Feraheme. I will see her back in 4 weeks to review her CBC and iron levels.  I advised her that her initial iron placement replacement will take several weeks and the initial iron she receives will go towards making blood, then additional iron will go towards restoring her iron stores. I have advised her that if her blood counts do not completely normalize after her iron stores are replete we will look at other causes of anemia. We may need to consider a GI evaluation as well. Her last colonoscopy was in 2013 but a marginal prep was noted. Given her history of gastric bypass I have also recommended adding a B12 level to her next set of labs.   We discussed some of the risks, benefits, and alternatives of intravenous iron infusions. The patient is symptomatic from anemia and the iron level is critically low. Some of the side-effects to be expected including risks of infusion reactions, phlebitis, headaches, nausea and fatigue.  The patient is willing to proceed. Goal is to keep ferritin level greater than or equal to 100 ng/ml

## 2014-05-14 ENCOUNTER — Encounter (HOSPITAL_BASED_OUTPATIENT_CLINIC_OR_DEPARTMENT_OTHER): Payer: Managed Care, Other (non HMO)

## 2014-05-14 DIAGNOSIS — D509 Iron deficiency anemia, unspecified: Secondary | ICD-10-CM

## 2014-05-14 MED ORDER — SODIUM CHLORIDE 0.9 % IV SOLN
Freq: Once | INTRAVENOUS | Status: AC
  Administered 2014-05-14: 15:00:00 via INTRAVENOUS

## 2014-05-14 MED ORDER — SODIUM CHLORIDE 0.9 % IJ SOLN
10.0000 mL | Freq: Once | INTRAMUSCULAR | Status: AC
  Administered 2014-05-14: 10 mL via INTRAVENOUS

## 2014-05-14 MED ORDER — SODIUM CHLORIDE 0.9 % IV SOLN
510.0000 mg | Freq: Once | INTRAVENOUS | Status: AC
  Administered 2014-05-14: 510 mg via INTRAVENOUS
  Filled 2014-05-14: qty 17

## 2014-05-14 NOTE — Progress Notes (Signed)
Tolerated iron infusion well. 

## 2014-05-14 NOTE — Patient Instructions (Signed)
[facility] at [facility] Discharge Instructions  RECOMMENDATIONS MADE BY THE CONSULTANT AND ANY TEST RESULTS WILL BE SENT TO YOUR REFERRING PHYSICIAN.  Iron infusion today as ordered. Return as scheduled.  Thank you for choosing [facility] at [facility] to provide your oncology and hematology care.  To afford each patient quality time with our provider, please arrive at least 15 minutes before your scheduled appointment time.    You need to re-schedule your appointment should you arrive 10 or more minutes late.  We strive to give you quality time with our providers, and arriving late affects you and other patients whose appointments are after yours.  Also, if you no show three or more times for appointments you may be dismissed from the clinic at the providers discretion.     Again, thank you for choosing [facility].  Our hope is that these requests will decrease the amount of time that you wait before being seen by our physicians.       _____________________________________________________________  Should you have questions after your visit to [facility], please contact our office at [phone] between the hours of 8:30 a.m. and 4:30 p.m.  Voicemails left after 4:30 p.m. will not be returned until the following business day.  For prescription refill requests, have your pharmacy contact our office.    

## 2014-05-16 ENCOUNTER — Encounter: Payer: Self-pay | Admitting: Neurology

## 2014-05-17 ENCOUNTER — Other Ambulatory Visit (HOSPITAL_COMMUNITY): Payer: Self-pay | Admitting: "Endocrinology

## 2014-05-17 DIAGNOSIS — E210 Primary hyperparathyroidism: Secondary | ICD-10-CM

## 2014-05-18 ENCOUNTER — Ambulatory Visit: Payer: Managed Care, Other (non HMO) | Admitting: Neurology

## 2014-05-21 ENCOUNTER — Encounter (HOSPITAL_COMMUNITY): Payer: Managed Care, Other (non HMO)

## 2014-05-22 ENCOUNTER — Encounter (HOSPITAL_COMMUNITY): Payer: Managed Care, Other (non HMO)

## 2014-05-22 ENCOUNTER — Ambulatory Visit (HOSPITAL_COMMUNITY): Payer: Managed Care, Other (non HMO)

## 2014-05-24 ENCOUNTER — Encounter (HOSPITAL_COMMUNITY): Payer: Self-pay

## 2014-05-24 ENCOUNTER — Encounter (HOSPITAL_COMMUNITY): Payer: Managed Care, Other (non HMO) | Attending: Hematology & Oncology

## 2014-05-24 DIAGNOSIS — Z9884 Bariatric surgery status: Secondary | ICD-10-CM

## 2014-05-24 DIAGNOSIS — D509 Iron deficiency anemia, unspecified: Secondary | ICD-10-CM | POA: Insufficient documentation

## 2014-05-24 MED ORDER — SODIUM CHLORIDE 0.9 % IV SOLN
Freq: Once | INTRAVENOUS | Status: AC
  Administered 2014-05-24: 15:00:00 via INTRAVENOUS

## 2014-05-24 MED ORDER — SODIUM CHLORIDE 0.9 % IV SOLN
510.0000 mg | Freq: Once | INTRAVENOUS | Status: AC
  Administered 2014-05-24: 510 mg via INTRAVENOUS
  Filled 2014-05-24: qty 17

## 2014-05-24 NOTE — Progress Notes (Signed)
Gwynneth Aliment Tolerated iron infusion well today.  Discharged ambulatory

## 2014-05-24 NOTE — Patient Instructions (Signed)
[facility] at [facility]  Discharge Instructions:  You had an iron infusion today. Follow up as scheduled Call the clinic if you have any questions or concerns _______________________________________________________________  Thank you for choosing [facility] at [facility] to provide your oncology and hematology care.  To afford each patient quality time with our providers, please arrive at least 15 minutes before your scheduled appointment.  You need to re-schedule your appointment if you arrive 10 or more minutes late.  We strive to give you quality time with our providers, and arriving late affects you and other patients whose appointments are after yours.  Also, if you no show three or more times for appointments you may be dismissed from the clinic.  Again, thank you for choosing [facility]. Our hope is that these requests will allow you access to exceptional care and in a timely manner. _______________________________________________________________  If you have questions after your visit, please contact our office at [phone] between the hours of 8:30 a.m. and 5:00 p.m. Voicemails left after 4:30 p.m. will not be returned until the following business day. _______________________________________________________________  For prescription refill requests, have your pharmacy contact our office. _______________________________________________________________  Recommendations made by the consultant and any test results will be sent to your referring physician. _______________________________________________________________ 

## 2014-05-28 ENCOUNTER — Encounter (HOSPITAL_COMMUNITY): Payer: Managed Care, Other (non HMO)

## 2014-05-30 ENCOUNTER — Encounter (HOSPITAL_COMMUNITY): Payer: Managed Care, Other (non HMO) | Attending: Hematology & Oncology

## 2014-05-30 ENCOUNTER — Encounter (HOSPITAL_COMMUNITY): Payer: Self-pay

## 2014-05-30 DIAGNOSIS — D509 Iron deficiency anemia, unspecified: Secondary | ICD-10-CM

## 2014-05-30 MED ORDER — SODIUM CHLORIDE 0.9 % IV SOLN
Freq: Once | INTRAVENOUS | Status: AC
  Administered 2014-05-30: 500 mL via INTRAVENOUS

## 2014-05-30 MED ORDER — SODIUM CHLORIDE 0.9 % IV SOLN
510.0000 mg | Freq: Once | INTRAVENOUS | Status: AC
  Administered 2014-05-30: 510 mg via INTRAVENOUS
  Filled 2014-05-30: qty 17

## 2014-05-30 NOTE — Progress Notes (Signed)
Tolerated iv iron well

## 2014-05-30 NOTE — Patient Instructions (Signed)
[place] at [facility] Discharge Instructions  RECOMMENDATIONS MADE BY THE CONSULTANT AND ANY TEST RESULTS WILL BE SENT TO YOUR REFERRING PHYSICIAN.  You got IV Feraheme today and call for any concerns. Will see you back at you next appointment for labs and infusion.  Thank you for choosing [place] at [facility] to provide your oncology and hematology care.  To afford each patient quality time with our provider, please arrive at least 15 minutes before your scheduled appointment time.    You need to re-schedule your appointment should you arrive 10 or more minutes late.  We strive to give you quality time with our providers, and arriving late affects you and other patients whose appointments are after yours.  Also, if you no show three or more times for appointments you may be dismissed from the clinic at the providers discretion.     Again, thank you for choosing [facility].  Our hope is that these requests will decrease the amount of time that you wait before being seen by our physicians.       _____________________________________________________________  Should you have questions after your visit to [facility], please contact our office at [phone] between the hours of 8:30 a.m. and 4:30 p.m.  Voicemails left after 4:30 p.m. will not be returned until the following business day.  For prescription refill requests, have your pharmacy contact our office.

## 2014-05-31 ENCOUNTER — Encounter (HOSPITAL_COMMUNITY): Payer: Self-pay

## 2014-05-31 ENCOUNTER — Encounter (HOSPITAL_COMMUNITY)
Admission: RE | Admit: 2014-05-31 | Discharge: 2014-05-31 | Disposition: A | Discharge status: Home / Self Care | Payer: Managed Care, Other (non HMO) | Source: Ambulatory Visit | Attending: "Endocrinology | Admitting: "Endocrinology

## 2014-05-31 DIAGNOSIS — E213 Hyperparathyroidism, unspecified: Secondary | ICD-10-CM | POA: Insufficient documentation

## 2014-05-31 DIAGNOSIS — R946 Abnormal results of thyroid function studies: Secondary | ICD-10-CM | POA: Insufficient documentation

## 2014-05-31 DIAGNOSIS — E210 Primary hyperparathyroidism: Secondary | ICD-10-CM

## 2014-05-31 MED ORDER — TECHNETIUM TC 99M SESTAMIBI GENERIC - CARDIOLITE
25.0000 | Freq: Once | INTRAVENOUS | Status: AC | PRN
  Administered 2014-05-31: 25 via INTRAVENOUS

## 2014-06-04 ENCOUNTER — Encounter (HOSPITAL_COMMUNITY): Payer: Self-pay | Admitting: Oncology

## 2014-06-04 ENCOUNTER — Encounter (HOSPITAL_COMMUNITY): Payer: Managed Care, Other (non HMO) | Attending: Oncology | Admitting: Oncology

## 2014-06-04 ENCOUNTER — Encounter (HOSPITAL_COMMUNITY): Payer: Managed Care, Other (non HMO) | Attending: Hematology & Oncology

## 2014-06-04 ENCOUNTER — Encounter (HOSPITAL_COMMUNITY): Payer: Managed Care, Other (non HMO)

## 2014-06-04 VITALS — BP 124/60 | HR 75 | Temp 98.9°F | Resp 18 | Wt 253.0 lb

## 2014-06-04 DIAGNOSIS — K909 Intestinal malabsorption, unspecified: Secondary | ICD-10-CM

## 2014-06-04 DIAGNOSIS — Z9884 Bariatric surgery status: Secondary | ICD-10-CM

## 2014-06-04 DIAGNOSIS — D509 Iron deficiency anemia, unspecified: Secondary | ICD-10-CM

## 2014-06-04 HISTORY — DX: Bariatric surgery status: Z98.84

## 2014-06-04 MED ORDER — SODIUM CHLORIDE 0.9 % IV SOLN
INTRAVENOUS | Status: DC
  Administered 2014-06-04: 14:00:00 via INTRAVENOUS

## 2014-06-04 MED ORDER — SODIUM CHLORIDE 0.9 % IV SOLN
510.0000 mg | Freq: Once | INTRAVENOUS | Status: AC
  Administered 2014-06-04: 510 mg via INTRAVENOUS
  Filled 2014-06-04: qty 17

## 2014-06-04 MED ORDER — SODIUM CHLORIDE 0.9 % IJ SOLN
10.0000 mL | Freq: Once | INTRAMUSCULAR | Status: AC
  Administered 2014-06-04: 10 mL via INTRAVENOUS

## 2014-06-04 NOTE — Assessment & Plan Note (Addendum)
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  S/P 4 weekly IV Feraheme 510 mg infusions as dictated above and she is receiving #5 currently.  Will cancel #6 next week since she is much improved symptomatically.  Return in 4 weeks for repeat labs and follow-up.

## 2014-06-04 NOTE — Assessment & Plan Note (Addendum)
Will add copper level B12 level to lab testing in 4 weeks.

## 2014-06-04 NOTE — Progress Notes (Signed)
Tolerated iron infusion well. 

## 2014-06-04 NOTE — Patient Instructions (Signed)
[place] at [facility] Discharge Instructions  RECOMMENDATIONS MADE BY THE CONSULTANT AND ANY TEST RESULTS WILL BE SENT TO YOUR REFERRING PHYSICIAN.  Hold iron infusion next week. Return in 4 weeks for lab work and office visit.  Thank you for choosing [place] at [facility] to provide your oncology and hematology care.  To afford each patient quality time with our provider, please arrive at least 15 minutes before your scheduled appointment time.    You need to re-schedule your appointment should you arrive 10 or more minutes late.  We strive to give you quality time with our providers, and arriving late affects you and other patients whose appointments are after yours.  Also, if you no show three or more times for appointments you may be dismissed from the clinic at the providers discretion.     Again, thank you for choosing [facility].  Our hope is that these requests will decrease the amount of time that you wait before being seen by our physicians.       _____________________________________________________________  Should you have questions after your visit to [facility], please contact our office at [phone] between the hours of 8:30 a.m. and 4:30 p.m.  Voicemails left after 4:30 p.m. will not be returned until the following business day.  For prescription refill requests, have your pharmacy contact our office.

## 2014-06-04 NOTE — Progress Notes (Signed)
Kimberly Kilts, MD [place] [id number]  Iron deficiency anemia - Plan: CBC with Differential, Ferritin, Iron and TIBC  H/O gastric bypass - Plan: Copper, serum, Vitamin D 25 hydroxy, Copper, serum, Vitamin D 25 hydroxy, Copper, serum  CURRENT THERAPY: S/P weekly IV Feraheme 510 mg x 4 (05/07/2014- 05/30/2014).  INTERVAL HISTORY: Kimberly [name] 59 y.o. 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     I personally reviewed and went over laboratory results with the patient.  The results are noted within this dictation.  She is getting IV Feraheme today for #5.  I will not give her #6 next week to avoid overshooting her ferritin level.    Symptomatically, the patient reports that her restless leg syndrome is significantly improved and actually resolved.  She is please about that.  Hematologically, she otherwise denies any complaints and ROS questioning is negative.   Past Medical History  Diagnosis Date  . Diabetes mellitus     resolved since gastric bypass in 2009  . Hypertension     resolved since gastric bypass in 2009  . Hypercholesterolemia     resolved since gastric bypass in 2009  . Obesity   . Depression   . RLS (restless legs syndrome) 05/02/2014  . Anemia   . 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     is allergic to kiwi extract.  Kimberly [name] does not currently have medications on file.  Past Surgical History  Procedure Laterality Date  . Gastric bypass  01/2008  . Incontinence surgery  2008  . Cholecystectomy  1986  . Endometrial ablation    . Colonoscopy  11/19/2011    Procedure: COLONOSCOPY;  Surgeon: Daneil Dolin, MD;  [facility];  Service: Endoscopy;  Laterality: N/A;  9:30    Denies any headaches,  dizziness, double vision, fevers, chills, night sweats, nausea, vomiting, diarrhea, constipation, chest pain, heart palpitations, shortness of breath, blood in stool, black tarry stool, urinary pain, urinary burning, urinary frequency, hematuria.   PHYSICAL EXAMINATION  ECOG PERFORMANCE STATUS: 0 - Asymptomatic  Filed Vitals:   06/04/14 1428  BP: 115/92  Pulse: 75  Temp: 98.9 F (37.2 C)  Resp: 18    GENERAL:alert, no distress, well nourished, well developed, obese and smiling SKIN: skin color, texture, turgor are normal, no rashes or significant lesions HEAD: Normocephalic, No masses, lesions, tenderness or abnormalities EYES: normal, PERRLA, EOMI, Conjunctiva are pink and non-injected EARS: External ears normal OROPHARYNX:lips, buccal mucosa, and tongue normal and mucous membranes are moist  NECK: supple, trachea midline LYMPH:  not examined BREAST:not examined LUNGS: clear to auscultation  HEART: regular rate & rhythm ABDOMEN:abdomen soft, obese and normal bowel sounds BACK: Back symmetric, no curvature. EXTREMITIES:less then 2 second capillary refill, no joint deformities, effusion, or inflammation, no cyanosis  NEURO: alert & oriented x 3 with fluent speech, no focal motor/sensory deficits   LABORATORY DATA: CBC    Component Value Date/Time   WBC 5.2 05/22/2011 1654   RBC 4.42 05/22/2011 1654   HGB 11.4* 05/22/2011 1654   HCT 36 05/22/2011 1654   MCH 25.8 05/22/2011 1654   MCHC 31.4 05/22/2011 1654   RDW 16.1 05/22/2011 1654      Chemistry      Component Value  Date/Time   NA 141 05/22/2011 1655   K 4.1 05/22/2011 1655   CO2 23 05/22/2011 1655   BUN 16 05/22/2011 1655   CREATININE 0.58 05/22/2011 1655      Component Value Date/Time   CALCIUM 8.8 05/22/2011 1655   ALKPHOS 153 05/22/2011 1655   AST 22 05/22/2011 1655   ALT 18 05/22/2011 1655   BILITOT 0.3 05/22/2011 1655     Lab Results  Component Value Date   FERRITIN 5.0* 05/22/2011     RADIOGRAPHIC STUDIES:  Nm Parathyroid W/spect  05/31/2014   CLINICAL DATA:  Hyperparathyroidism.  Elevated PTH.  EXAM: NM PARATHYROID SCINTIGRAPHY AND SPECT IMAGING  TECHNIQUE: Following intravenous administration of radiopharmaceutical, early and 2-hour delayed planar images were obtained in the anterior projection. Delayed triplanar SPECT images were also obtained at 2 hours.  RADIOPHARMACEUTICALS:  Twenty-five mCiTc-56m Sestamibi IV  COMPARISON:  None.  FINDINGS: There is residual activity within the thyroid gland on the 2 hour imaging. No focal activity. SPECT imaging of the head and neck demonstrate no activity in the thyroid bed above the thyroid gland activity. A portion of the inferior pole of the left lobe of the thyroid gland does extend asymmetrically anterior compared to the lower pole on the right.  IMPRESSION: 1. No clear evidence of parathyroid adenoma in the neck or chest on planar or SPECT imaging. 2. 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   Electronically Signed   By: Suzy Bouchard M.D.   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  S/P 4 weekly IV Feraheme 510 mg infusions as dictated above and she is receiving #5 currently.  Will cancel #6 next week since she is much improved symptomatically.  Return in 4 weeks for repeat labs and follow-up.   H/O gastric bypass Will add copper level B12 level to lab testing in 4 weeks.    THERAPY PLAN:  Will monitor labs and provide supportive interventions as needed.   All questions were answered. The patient knows to call the clinic with any problems, questions or concerns. We can certainly see the patient much sooner if necessary.  Patient and plan discussed with Dr. Ancil Linsey and she is in agreement with the aforementioned.   This note is  electronically signed by: Robynn Pane 06/04/2014 2:43 PM

## 2014-06-04 NOTE — Patient Instructions (Signed)
[facility] at [facility] Discharge Instructions  RECOMMENDATIONS MADE BY THE CONSULTANT AND ANY TEST RESULTS WILL BE SENT TO YOUR REFERRING PHYSICIAN.  Iron infusion today as ordered. Return as scheduled.  Thank you for choosing [facility] at [facility] to provide your oncology and hematology care.  To afford each patient quality time with our provider, please arrive at least 15 minutes before your scheduled appointment time.    You need to re-schedule your appointment should you arrive 10 or more minutes late.  We strive to give you quality time with our providers, and arriving late affects you and other patients whose appointments are after yours.  Also, if you no show three or more times for appointments you may be dismissed from the clinic at the providers discretion.     Again, thank you for choosing [facility].  Our hope is that these requests will decrease the amount of time that you wait before being seen by our physicians.       _____________________________________________________________  Should you have questions after your visit to [facility], please contact our office at [phone] between the hours of 8:30 a.m. and 4:30 p.m.  Voicemails left after 4:30 p.m. will not be returned until the following business day.  For prescription refill requests, have your pharmacy contact our office.    

## 2014-06-11 ENCOUNTER — Encounter (HOSPITAL_COMMUNITY): Payer: Managed Care, Other (non HMO)

## 2014-07-03 ENCOUNTER — Encounter (HOSPITAL_COMMUNITY): Payer: Managed Care, Other (non HMO) | Attending: Hematology & Oncology

## 2014-07-03 DIAGNOSIS — Z9884 Bariatric surgery status: Secondary | ICD-10-CM | POA: Diagnosis not present

## 2014-07-03 DIAGNOSIS — D509 Iron deficiency anemia, unspecified: Secondary | ICD-10-CM

## 2014-07-03 LAB — CBC WITH DIFFERENTIAL/PLATELET
BASOS PCT: 0 % (ref 0–1)
Basophils Absolute: 0 10*3/uL (ref 0.0–0.1)
Eosinophils Absolute: 0.2 10*3/uL (ref 0.0–0.7)
Eosinophils Relative: 2 % (ref 0–5)
HCT: 44.7 % (ref 36.0–46.0)
HEMOGLOBIN: 14.5 g/dL (ref 12.0–15.0)
Lymphocytes Relative: 23 % (ref 12–46)
Lymphs Abs: 1.8 10*3/uL (ref 0.7–4.0)
MCH: 28.4 pg (ref 26.0–34.0)
MCHC: 32.4 g/dL (ref 30.0–36.0)
MCV: 87.5 fL (ref 78.0–100.0)
Monocytes Absolute: 0.5 10*3/uL (ref 0.1–1.0)
Monocytes Relative: 6 % (ref 3–12)
NEUTROS PCT: 68 % (ref 43–77)
Neutro Abs: 5.3 10*3/uL (ref 1.7–7.7)
PLATELETS: 233 10*3/uL (ref 150–400)
RBC: 5.11 MIL/uL (ref 3.87–5.11)
RDW: 24.1 % — ABNORMAL HIGH (ref 11.5–15.5)
WBC: 7.8 10*3/uL (ref 4.0–10.5)

## 2014-07-03 NOTE — Progress Notes (Signed)
Labs drawn

## 2014-07-04 LAB — IRON AND TIBC
Iron: 157 ug/dL — ABNORMAL HIGH (ref 42–145)
Saturation Ratios: 51 % (ref 20–55)
TIBC: 309 ug/dL (ref 250–470)
UIBC: 152 ug/dL (ref 125–400)

## 2014-07-04 LAB — FERRITIN: FERRITIN: 409 ng/mL — AB (ref 10–291)

## 2014-07-04 NOTE — Assessment & Plan Note (Signed)
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  S/P 5 weekly IV Feraheme 510 mg infusions as dictated above.  Labs reviewed.  Return in 6 weeks and 12 weeks for labs: CBC diff, iron/TIBC, ferritin.  Return in 12 weeks for follow-up.

## 2014-07-04 NOTE — Progress Notes (Signed)
Kimberly Kilts, MD [place] [address]  Iron deficiency anemia - Plan: CBC with Differential, Iron and TIBC, Ferritin  CURRENT THERAPY: S/P weekly IV Feraheme 510 mg x 5 (05/07/2014- 06/04/2014).  INTERVAL HISTORY: Kimberly [name] 59 y.o. 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   I personally reviewed and went over laboratory results with the patient.  The results are noted within this dictation.  She notes that her restless leg symptoms have completely resolved and she notes that she feels great.  She reports that she never felt poor, her RL symptoms were very bothersome for her.  She asks about donating blood which she is free to do, we will just need to be cognizant of that so we can monitor her labs and particularly her iron studies.  At this time she does not wish to donate blood.   Hematologically, she denies any complaints and ROS questioning is negative.   Past Medical History  Diagnosis Date  . Diabetes mellitus     resolved since gastric bypass in 2009  . Hypertension     resolved since gastric bypass in 2009  . Hypercholesterolemia     resolved since gastric bypass in 2009  . Obesity   . Depression   . RLS (restless legs syndrome) 05/02/2014  . Anemia   . 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     is allergic to kiwi extract.  Ms. Irigoyen does not currently have medications on file.  Past Surgical History  Procedure Laterality Date  . Gastric bypass  01/2008  . Incontinence surgery  2008  . Cholecystectomy  1986  . Endometrial ablation    . Colonoscopy  11/19/2011    Procedure: COLONOSCOPY;  Surgeon: Daneil Dolin, MD;  [facility];  Service: Endoscopy;  Laterality: N/A;  9:30    Denies any headaches,  dizziness, double vision, fevers, chills, night sweats, nausea, vomiting, diarrhea, constipation, chest pain, heart palpitations, shortness of breath, blood in stool, black tarry stool, urinary pain, urinary burning, urinary frequency, hematuria.   PHYSICAL EXAMINATION  ECOG PERFORMANCE STATUS: 0 - Asymptomatic  Filed Vitals:   07/05/14 1402  BP: 141/65  Pulse: 59  Temp: 98.8 F (37.1 C)  Resp: 18    GENERAL:alert, no distress, well nourished, well developed, comfortable, cooperative, obese and smiling SKIN: skin color, texture, turgor are normal, no rashes or significant lesions HEAD: Normocephalic, No masses, lesions, tenderness or abnormalities EYES: normal, PERRLA, EOMI, Conjunctiva are pink and non-injected EARS: External ears normal OROPHARYNX:lips, buccal mucosa, and tongue normal and mucous membranes are moist  NECK: supple, no adenopathy, thyroid normal size, non-tender, without nodularity, no stridor, non-tender, trachea midline LYMPH:  no palpable lymphadenopathy BREAST:not examined LUNGS: clear to auscultation  HEART: regular rate & rhythm ABDOMEN:abdomen soft, obese and normal bowel sounds BACK: Back symmetric, no curvature., No CVA tenderness EXTREMITIES:less then 2 second capillary refill, no joint deformities, effusion, or inflammation, no edema, no skin discoloration, no clubbing, no cyanosis  NEURO: alert & oriented x 3 with fluent speech, no focal motor/sensory deficits, gait normal    LABORATORY DATA: CBC    Component Value Date/Time   WBC 7.8 07/03/2014 1102   RBC 5.11 07/03/2014 1102   HGB 14.5 07/03/2014 1102   HCT  44.7 07/03/2014 1102   HCT 36 05/22/2011 1654   PLT 233 07/03/2014 1102   MCV 87.5 07/03/2014 1102   MCH 28.4 07/03/2014 1102   MCHC 32.4 07/03/2014 1102   RDW 24.1* 07/03/2014 1102   RDW 16.1 05/22/2011 1654   LYMPHSABS 1.8 07/03/2014 1102   MONOABS 0.5 07/03/2014 1102   EOSABS 0.2 07/03/2014 1102   BASOSABS 0.0 07/03/2014 1102       Chemistry      Component Value Date/Time   NA 141 05/22/2011 1655   K 4.1 05/22/2011 1655   CO2 23 05/22/2011 1655   BUN 16 05/22/2011 1655   CREATININE 0.58 05/22/2011 1655      Component Value Date/Time   CALCIUM 8.8 05/22/2011 1655   ALKPHOS 153 05/22/2011 1655   AST 22 05/22/2011 1655   ALT 18 05/22/2011 1655   BILITOT 0.3 05/22/2011 1655      Lab Results  Component Value Date   IRON 157* 07/03/2014   TIBC 309 07/03/2014   FERRITIN 409* 07/03/2014     ASSESSMENT AND PLAN:  Iron deficiency anemia 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  S/P 5 weekly IV Feraheme 510 mg infusions as dictated above.  Labs reviewed.  Return in 6 weeks and 12 weeks for labs: CBC diff, iron/TIBC, ferritin.  Return in 12 weeks for follow-up.   THERAPY PLAN:  Will follow labs along and address iron deficiency as needed.   All questions were answered. The patient knows to call the clinic with any problems, questions or concerns. We can certainly see the patient much sooner if necessary.  Patient and plan discussed with Dr. Ancil Linsey and she is in agreement with the aforementioned.   This note is electronically signed by: Robynn Pane 07/05/2014 2:29 PM

## 2014-07-05 ENCOUNTER — Encounter (HOSPITAL_COMMUNITY): Payer: Self-pay | Admitting: Oncology

## 2014-07-05 ENCOUNTER — Encounter (HOSPITAL_BASED_OUTPATIENT_CLINIC_OR_DEPARTMENT_OTHER): Payer: Managed Care, Other (non HMO) | Admitting: Oncology

## 2014-07-05 ENCOUNTER — Other Ambulatory Visit (HOSPITAL_COMMUNITY): Payer: Managed Care, Other (non HMO)

## 2014-07-05 VITALS — BP 141/65 | HR 59 | Temp 98.8°F | Resp 18 | Wt 253.6 lb

## 2014-07-05 DIAGNOSIS — D508 Other iron deficiency anemias: Secondary | ICD-10-CM | POA: Diagnosis not present

## 2014-07-05 DIAGNOSIS — K9089 Other intestinal malabsorption: Secondary | ICD-10-CM | POA: Diagnosis not present

## 2014-07-05 DIAGNOSIS — D509 Iron deficiency anemia, unspecified: Secondary | ICD-10-CM

## 2014-07-05 DIAGNOSIS — Z9884 Bariatric surgery status: Secondary | ICD-10-CM

## 2014-07-05 NOTE — Patient Instructions (Addendum)
[place] at [facility]  Discharge Instructions:  You have responded nicely to iron infusions.  Your labs are very good and follow below.  We will check labs in 6 weeks and 12 weeks.  We will see you back in 12 weeks for follow-up.  CBC    Component Value Date/Time   WBC 7.8 07/03/2014 1102   RBC 5.11 07/03/2014 1102   HGB 14.5 07/03/2014 1102   HCT 44.7 07/03/2014 1102   HCT 36 05/22/2011 1654   PLT 233 07/03/2014 1102   MCV 87.5 07/03/2014 1102   MCH 28.4 07/03/2014 1102   MCHC 32.4 07/03/2014 1102   RDW 24.1* 07/03/2014 1102   RDW 16.1 05/22/2011 1654   LYMPHSABS 1.8 07/03/2014 1102   MONOABS 0.5 07/03/2014 1102   EOSABS 0.2 07/03/2014 1102   BASOSABS 0.0 07/03/2014 1102    Lab Results  Component Value Date   IRON 157* 07/03/2014   TIBC 309 07/03/2014   FERRITIN 409* 07/03/2014        _______________________________________________________________  Thank you for choosing [place] at [facility] to provide your oncology and hematology care.  To afford each patient quality time with our providers, please arrive at least 15 minutes before your scheduled appointment.  You need to re-schedule your appointment if you arrive 10 or more minutes late.  We strive to give you quality time with our providers, and arriving late affects you and other patients whose appointments are after yours.  Also, if you no show three or more times for appointments you may be dismissed from the clinic.  Again, thank you for choosing [place] [facility] [place] hope is that these requests will allow you access to exceptional care and in a timely manner. _______________________________________________________________  If you have questions after your visit, please contact our office at [phone] between the hours of 8:30 a.m. and 5:00 p.m. Voicemails left after 4:30 p.m. will not be returned until the following business  day. _______________________________________________________________  For prescription refill requests, have your pharmacy contact our office. _______________________________________________________________  Recommendations made by the consultant and any test results will be sent to your referring physician. _______________________________________________________________

## 2014-07-06 LAB — COPPER, SERUM: COPPER: 97 ug/dL (ref 70–175)

## 2014-07-12 ENCOUNTER — Ambulatory Visit (HOSPITAL_COMMUNITY): Payer: Managed Care, Other (non HMO) | Attending: Orthopedic Surgery | Admitting: Physical Therapy

## 2014-07-12 DIAGNOSIS — M25571 Pain in right ankle and joints of right foot: Secondary | ICD-10-CM | POA: Diagnosis present

## 2014-07-12 DIAGNOSIS — R269 Unspecified abnormalities of gait and mobility: Secondary | ICD-10-CM | POA: Diagnosis not present

## 2014-07-12 DIAGNOSIS — M25651 Stiffness of right hip, not elsewhere classified: Secondary | ICD-10-CM | POA: Insufficient documentation

## 2014-07-12 NOTE — Therapy (Signed)
[place], [place], [address] Phone: [phone]   Fax:  [phone]  Physical Therapy Evaluation  Patient Details  Name: Kimberly [name] MRN: [id number] Date of Birth: [birth year] Referring Provider:  Wylene Simmer, MD  Encounter Date: 07/12/2014      PT End of Session - 07/12/14 1830    Visit Number 1   Number of Visits 16   Date for PT Re-Evaluation 08/11/14   Authorization Type [employer]   Authorization - Visit Number 1   Authorization - Number of Visits 16   PT Start Time 9833   PT Stop Time 1815   PT Time Calculation (min) 45 min   Activity Tolerance Patient tolerated treatment well   Behavior During Therapy [facility] for tasks assessed/performed      Past Medical History  Diagnosis Date  . Diabetes mellitus     resolved since gastric bypass in 2009  . Hypertension     resolved since gastric bypass in 2009  . Hypercholesterolemia     resolved since gastric bypass in 2009  . Obesity   . Depression   . RLS (restless legs syndrome) 05/02/2014  . Anemia   . 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 Gastric bypass  01/2008  . Incontinence surgery  2008  . Cholecystectomy  1986  . Endometrial ablation    . Colonoscopy  11/19/2011    Procedure: COLONOSCOPY;  Surgeon: Daneil Dolin, MD;  [facility];  Service: Endoscopy;  Laterality: N/A;  9:30    There were no vitals filed for this visit.  Visit Diagnosis:  Pain in joint, ankle and foot, right - Plan: PT plan of care cert/re-cert  Abnormality of gait - Plan: PT plan of care cert/re-cert  Hip stiffness, right - Plan: PT plan of care cert/re-cert      Subjective Assessment - 07/12/14 1741    Subjective burning   Pertinent History Patient began havign Rt foot pain in january along Rt lateral malleolus and Rt medial foot between navicular and cuneiform bones (patient pointed). pain began gradually  and never had this pain before. previous history of plantar fascitiis but nothing like this.    How long can you sit comfortably? no difficulty   Patient Stated Goals to decrease foot and ankle pain, and increase strength so patient can come up on toes to reach a high shelf. And so patient can wear shoes other than walking shoes. And to be able to golf.    Currently in Pain? Yes   Pain Score 6    Pain Location Foot   Pain Orientation Right   Pain Descriptors / Indicators Burning   Pain Type Chronic pain   Pain Onset More than a month ago   Pain Frequency Intermittent   Aggravating Factors  bad shoes, walking   Pain Relieving Factors good shoes, aleve,             [facility] PT Assessment - 07/12/14 0001    Assessment   Medical Diagnosis Rt peroneal tendonitis and Rt posterior tibialis tendonitis. resultign in difficulty walking.    Onset Date 03/26/14   Next MD Visit John hewitt, 08/29/14   Balance Screen   Has the patient fallen in the past 6 months No   Has the patient had a decrease in activity level because of a fear of falling?  No   Is the patient reluctant to  leave their home because of a fear of falling?  No   Prior Function   Level of Independence Independent with basic ADLs   Observation/Other Assessments   Focus on Therapeutic Outcomes (FOTO)  57% limited   Functional Tests   Functional tests Other;Other2   Other:   Other/ Comments Gait: flat feet, excessive right foot toe out, limited ability to toe in, excessive knee valgus moment, early heel off on Rt.    Other:   Other/Comments plantar fascitis test: negative   ROM / Strength   AROM / PROM / Strength AROM;Strength   AROM   AROM Assessment Site Hip;Knee;Ankle   Right/Left Hip Right;Left   Right Hip External Rotation  55   Right Hip Internal Rotation  7   Left Hip External Rotation  55   Left Hip Internal Rotation  25   Right/Left Knee Right;Left   Right/Left Ankle Right;Left   Right Ankle Dorsiflexion 15    Right Ankle Plantar Flexion 64   Right Ankle Inversion 50   Right Ankle Eversion 14   Left Ankle Dorsiflexion 15   Left Ankle Plantar Flexion 65   Left Ankle Inversion 39   Left Ankle Eversion 19   Strength   Strength Assessment Site Ankle;Knee;Hip   Right/Left Hip Right;Left   Right Hip ABduction 3+/5   Right/Left Knee Right;Left   Right/Left Ankle Right;Left   Right Ankle Plantar Flexion 3-/5   Right Ankle Inversion 4-/5   Right Ankle Eversion 4-/5   Left Ankle Dorsiflexion 5/5   Left Ankle Plantar Flexion 4-/5   Left Ankle Inversion 4+/5   Left Ankle Eversion 4+/5   Flexibility   Soft Tissue Assessment /Muscle Length yes   Hamstrings WNL   Quadriceps limited bilaterally   Piriformis limitd bilaterally          [facility] Adult PT Treatment/Exercise - 07/12/14 0001    Exercises   Exercises Knee/Hip   Knee/Hip Exercises: Stretches   Piriformis Stretch 3 reps;20 seconds   Knee/Hip Exercises: Standing   Functional Squat Limitations Squat walk arounds           PT Education - 07/12/14 1829    Education provided Yes   Education Details Diagnosis, prognosis, HEP: squat walk arounds and piriformis stretch   Person(s) Educated Patient   Methods Explanation;Demonstration;Handout   Comprehension Verbalized understanding;Returned demonstration          PT Short Term Goals - 07/12/14 1837    PT SHORT TERM GOAL #1   Title Patint will dmeonstrate increased hip internal rotation on Rt to 25 degrees to be able to ambualte with improved foot gposition durign ground contact of gait.    Baseline 7 degrees on Rt, 25 on Lt   Time 4   Period Weeks   Status New   PT SHORT TERM GOAL #2   Title Patint will demosntrate increased glut med strength of 4-/5 to improve foot, kee and hip stability during gait.    Time 4   Period Weeks   Status New   PT SHORT TERM GOAL #3   Title Patient will demonstrate independence with HEP   Time 4   Period Weeks   Status New           PT  Long Term Goals - 07/12/14 1840    PT LONG TERM GOAL #1   Title Patint will demonstrate increased hip internal rotation 35 degrees Bilaterally to be able to ambulate with improved foot position during ground contact  of gait.    Time 8   Period Weeks   Status New   PT LONG TERM GOAL #2   Title Patint will demosntrate increased glut med strength of 4/5 to improve foot, kee and hip stability during gait.    Time 8   Period Weeks   Status New   PT LONG TERM GOAL #3   Title patient will dmeonstrate a 4+/5 gastroc sterength bilaterally to be able to stand on toes to reach to a high shelf   Time 8   Period Weeks   Status New   PT LONG TERM GOAL #4   Title Patient will be able to ambulate for 4 hours intermittently wihtout pain and on varying surfaces to return to golfing without pain.    Time 8   Period Weeks   Status New               Plan - 07/12/14 1831    Clinical Impression Statement Patient dispalsy Rt foot pain secondary to abdnormal gait mechanicns placign increased strain on peroneals prior to heel off and increased strain along navicular-cuneiform junction ot initial foot contact. Specifically patient ambualtes with Rt foot excessively toed our due to limited right hip internal rotation secondary to weakness resulting in excessive knee vlagus moment at foot strike and subsquent excessive medidial foot colllapse on Rt vs left. Patient will benefit from skilled physical therapy to increase Rt hip intenral ROM,  and Rt glute max strength as well as improve gait mechanincs so patient can ambualte to play goldf withtou pain.    Pt will benefit from skilled therapeutic intervention in order to improve on the following deficits Abnormal gait;Decreased endurance;Impaired flexibility;Improper body mechanics;Difficulty walking;Pain;Decreased range of motion   Rehab Potential Good   PT Frequency 2x / week   PT Duration 8 weeks   PT Treatment/Interventions Gait training;Functional  mobility training;Patient/family education;Therapeutic exercise;Manual techniques;Balance training   PT Next Visit Plan Manual to increase hip internal rotation, lateral hamstring stretch, groin stretch, 3 way calf stretch, piriformis stretch, squt walk arounds, 2D walking hip excursions, squat reach matrix and sumo walk.    PT Home Exercise Plan piriformis stretch and squat walk arounds.    Consulted and Agree with Plan of Care Patient         Problem List Patient Active Problem List   Diagnosis Date Noted  . 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 Iron deficiency anemia 05/07/2014  . RLS (restless legs syndrome) 05/02/2014  . Encounter for screening colonoscopy 04/29/2011   Devona Konig PT DPT [place], [place], [address] Phone: [phone]   Fax:  [phone]

## 2014-07-25 ENCOUNTER — Encounter (HOSPITAL_COMMUNITY): Payer: Self-pay | Admitting: Physical Therapy

## 2014-08-03 ENCOUNTER — Ambulatory Visit: Payer: Managed Care, Other (non HMO) | Admitting: Neurology

## 2014-08-08 ENCOUNTER — Ambulatory Visit: Payer: Managed Care, Other (non HMO) | Admitting: Neurology

## 2014-08-16 ENCOUNTER — Encounter (HOSPITAL_COMMUNITY): Payer: Managed Care, Other (non HMO)

## 2014-08-17 ENCOUNTER — Encounter (HOSPITAL_COMMUNITY): Payer: Managed Care, Other (non HMO) | Attending: Hematology & Oncology

## 2014-08-17 DIAGNOSIS — Z9884 Bariatric surgery status: Secondary | ICD-10-CM | POA: Insufficient documentation

## 2014-08-17 DIAGNOSIS — D509 Iron deficiency anemia, unspecified: Secondary | ICD-10-CM

## 2014-08-17 LAB — IRON AND TIBC
Iron: 108 ug/dL (ref 28–170)
Saturation Ratios: 35 % — ABNORMAL HIGH (ref 10.4–31.8)
TIBC: 311 ug/dL (ref 250–450)
UIBC: 203 ug/dL

## 2014-08-17 LAB — CBC WITH DIFFERENTIAL/PLATELET
BASOS PCT: 0 % (ref 0–1)
Basophils Absolute: 0 10*3/uL (ref 0.0–0.1)
EOS ABS: 0.1 10*3/uL (ref 0.0–0.7)
EOS PCT: 2 % (ref 0–5)
HEMATOCRIT: 40.5 % (ref 36.0–46.0)
Hemoglobin: 13 g/dL (ref 12.0–15.0)
Lymphocytes Relative: 28 % (ref 12–46)
Lymphs Abs: 1.9 10*3/uL (ref 0.7–4.0)
MCH: 30.2 pg (ref 26.0–34.0)
MCHC: 32.1 g/dL (ref 30.0–36.0)
MCV: 94 fL (ref 78.0–100.0)
MONOS PCT: 6 % (ref 3–12)
Monocytes Absolute: 0.4 10*3/uL (ref 0.1–1.0)
NEUTROS ABS: 4.3 10*3/uL (ref 1.7–7.7)
Neutrophils Relative %: 64 % (ref 43–77)
PLATELETS: 236 10*3/uL (ref 150–400)
RBC: 4.31 MIL/uL (ref 3.87–5.11)
RDW: 16.9 % — AB (ref 11.5–15.5)
WBC: 6.7 10*3/uL (ref 4.0–10.5)

## 2014-08-17 LAB — FERRITIN: Ferritin: 179 ng/mL (ref 11–307)

## 2014-08-17 NOTE — Progress Notes (Signed)
Labs drawn

## 2014-09-10 NOTE — Therapy (Signed)
[place], [place], [address] Phone: [phone]   Fax:  [phone]  Patient Details  Name: Kimberly [name] MRN: [id number] Date of Birth: Nov 06, 1955 Referring Provider:  Wylene Simmer, MD  Encounter Date: 09/10/2014  PHYSICAL THERAPY DISCHARGE SUMMARY  Visits from Start of Care: 1  Current functional level related to goals / functional outcomes: Unable to assess- did not return after initial evaluation    Remaining deficits: Unable to assess as patient has not returned since initial evaluation    Education / Equipment: At time of eval patient given appropriate HEP  Plan: Patient agrees to discharge.  Patient goals were not met. Patient is being discharged due to not returning since the last visit.  ?????       Deniece Ree PT, DPT [place] [address] [place], [place], [address] Phone: [phone]   Fax:  [phone]

## 2014-09-19 ENCOUNTER — Encounter (HOSPITAL_COMMUNITY): Payer: Managed Care, Other (non HMO) | Attending: Oncology

## 2014-09-19 DIAGNOSIS — D509 Iron deficiency anemia, unspecified: Secondary | ICD-10-CM | POA: Diagnosis not present

## 2014-09-19 LAB — CBC WITH DIFFERENTIAL/PLATELET
Basophils Absolute: 0 10*3/uL (ref 0.0–0.1)
Basophils Relative: 0 % (ref 0–1)
EOS PCT: 2 % (ref 0–5)
Eosinophils Absolute: 0.1 10*3/uL (ref 0.0–0.7)
HCT: 43.1 % (ref 36.0–46.0)
Hemoglobin: 14.1 g/dL (ref 12.0–15.0)
LYMPHS ABS: 1.6 10*3/uL (ref 0.7–4.0)
LYMPHS PCT: 21 % (ref 12–46)
MCH: 31.9 pg (ref 26.0–34.0)
MCHC: 32.7 g/dL (ref 30.0–36.0)
MCV: 97.5 fL (ref 78.0–100.0)
MONO ABS: 0.5 10*3/uL (ref 0.1–1.0)
Monocytes Relative: 6 % (ref 3–12)
Neutro Abs: 5.1 10*3/uL (ref 1.7–7.7)
Neutrophils Relative %: 71 % (ref 43–77)
Platelets: 229 10*3/uL (ref 150–400)
RBC: 4.42 MIL/uL (ref 3.87–5.11)
RDW: 13 % (ref 11.5–15.5)
WBC: 7.3 10*3/uL (ref 4.0–10.5)

## 2014-09-19 LAB — FERRITIN: Ferritin: 201 ng/mL (ref 11–307)

## 2014-09-19 LAB — IRON AND TIBC
Iron: 128 ug/dL (ref 28–170)
SATURATION RATIOS: 37 % — AB (ref 10.4–31.8)
TIBC: 349 ug/dL (ref 250–450)
UIBC: 221 ug/dL

## 2014-09-19 NOTE — Progress Notes (Signed)
Labs drawn

## 2014-09-21 ENCOUNTER — Encounter (HOSPITAL_COMMUNITY): Payer: Self-pay | Admitting: Oncology

## 2014-09-21 ENCOUNTER — Ambulatory Visit (HOSPITAL_COMMUNITY): Payer: Managed Care, Other (non HMO) | Admitting: Oncology

## 2014-09-21 ENCOUNTER — Encounter (HOSPITAL_COMMUNITY): Payer: Managed Care, Other (non HMO) | Attending: Hematology & Oncology | Admitting: Oncology

## 2014-09-21 VITALS — BP 138/63 | HR 72 | Temp 98.2°F | Resp 20 | Wt 254.8 lb

## 2014-09-21 DIAGNOSIS — Z9884 Bariatric surgery status: Secondary | ICD-10-CM | POA: Insufficient documentation

## 2014-09-21 DIAGNOSIS — K912 Postsurgical malabsorption, not elsewhere classified: Secondary | ICD-10-CM | POA: Diagnosis not present

## 2014-09-21 DIAGNOSIS — D509 Iron deficiency anemia, unspecified: Secondary | ICD-10-CM | POA: Diagnosis not present

## 2014-09-21 NOTE — Patient Instructions (Signed)
..  [place] at [facility] Discharge Instructions  RECOMMENDATIONS MADE BY THE CONSULTANT AND ANY TEST RESULTS WILL BE SENT TO YOUR REFERRING PHYSICIAN.  Seen and discussion with thomas Kefalas PA-C. Call the cancer center with any concerns and/or problems. Lab work in 8 weeks and then again in 25 weeks. Return in 4 months for follow up appt.    Thank you for choosing [place] at [facility] to provide your oncology and hematology care.  To afford each patient quality time with our provider, please arrive at least 15 minutes before your scheduled appointment time.    You need to re-schedule your appointment should you arrive 10 or more minutes late.  We strive to give you quality time with our providers, and arriving late affects you and other patients whose appointments are after yours.  Also, if you no show three or more times for appointments you may be dismissed from the clinic at the providers discretion.     Again, thank you for choosing [facility].  Our hope is that these requests will decrease the amount of time that you wait before being seen by our physicians.       _____________________________________________________________  Should you have questions after your visit to [facility], please contact our office at [phone] between the hours of 8:30 a.m. and 4:30 p.m.  Voicemails left after 4:30 p.m. will not be returned until the following business day.  For prescription refill requests, have your pharmacy contact our office.

## 2014-09-21 NOTE — Assessment & Plan Note (Signed)
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  Labs most recently are WNL.    Labs in 8 weeks and 16 weeks: CBC diff, iron/TIBC, ferritin  Vit D level in 16 weeks.  Return in 4 months for follow-up; sooner if needed for labs.

## 2014-09-21 NOTE — Progress Notes (Signed)
[name], MD [place] [address]  Iron deficiency anemia - Plan: CBC with Differential, Iron and TIBC, Ferritin, CBC with Differential, Iron and TIBC, Ferritin  H/O gastric bypass - Plan: Vitamin D 25 hydroxy  CURRENT THERAPY: S/P weekly IV Feraheme 510 mg x 5 (05/07/2014- 06/04/2014).  INTERVAL HISTORY: Kimberly [name] 59 y.o. 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   I personally reviewed and went over laboratory results with the patient.  The results are noted within this dictation.  Hgb and ferritin are WNL.  She denies any signs or symptoms of iron deficiency.  She denies any blood in stool and dark stools.   Past Medical History  Diagnosis Date  . Diabetes mellitus     resolved since gastric bypass in 2009  . Hypertension     resolved since gastric bypass in 2009  . Hypercholesterolemia     resolved since gastric bypass in 2009  . Obesity   . Depression   . RLS (restless legs syndrome) 05/02/2014  . Anemia   . 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     is allergic to kiwi extract.  Kimberly [name] does not currently have medications on file.  Past Surgical History  Procedure Laterality Date  . Gastric bypass  01/2008  . Incontinence surgery  2008  . Cholecystectomy  1986  . Endometrial ablation    . Colonoscopy  11/19/2011    Procedure: COLONOSCOPY;  Surgeon: Daneil Dolin, MD;  [facility];  Service: Endoscopy;  Laterality: N/A;  9:30    Denies any headaches, dizziness, double vision, fevers, chills, night sweats, nausea, vomiting, diarrhea, constipation, chest pain, heart palpitations, shortness of breath, blood in stool, black tarry stool, urinary pain, urinary burning, urinary frequency, hematuria.     PHYSICAL EXAMINATION  ECOG PERFORMANCE STATUS: 0 - Asymptomatic  Filed Vitals:   09/21/14 1414  BP: 138/63  Pulse: 72  Temp: 98.2 F (36.8 C)  Resp: 20    GENERAL:alert, no distress, well nourished, well developed, comfortable, cooperative, obese and smiling SKIN: skin color, texture, turgor are normal, no rashes or significant lesions HEAD: Normocephalic, No masses, lesions, tenderness or abnormalities EYES: normal, PERRLA, EOMI, Conjunctiva are pink and non-injected EARS: External ears normal OROPHARYNX:lips, buccal mucosa, and tongue normal and mucous membranes are moist  NECK: supple, no adenopathy, thyroid normal size, non-tender, without nodularity, no stridor, non-tender, trachea midline LYMPH:  no palpable lymphadenopathy BREAST:not examined LUNGS: clear to auscultation  HEART: regular rate & rhythm ABDOMEN:abdomen soft, obese and normal bowel sounds BACK: Back symmetric, no curvature., No CVA tenderness EXTREMITIES:less then 2 second capillary refill, no joint deformities, effusion, or inflammation, no edema, no skin discoloration, no clubbing, no cyanosis  NEURO: alert & oriented x 3 with fluent speech, no focal motor/sensory deficits, gait normal    LABORATORY DATA: CBC    Component Value Date/Time   WBC 7.3 09/19/2014 1028   RBC 4.42 09/19/2014 1028   HGB 14.1 09/19/2014 1028   HCT 43.1 09/19/2014 1028   HCT 36 05/22/2011 1654   PLT 229 09/19/2014 1028   MCV 97.5 09/19/2014 1028   MCH 31.9 09/19/2014 1028   MCHC 32.7 09/19/2014 1028   RDW 13.0 09/19/2014 1028   RDW 16.1 05/22/2011 1654  LYMPHSABS 1.6 09/19/2014 1028   MONOABS 0.5 09/19/2014 1028   EOSABS 0.1 09/19/2014 1028   BASOSABS 0.0 09/19/2014 1028      Chemistry      Component Value Date/Time   NA 141 05/22/2011 1655   K 4.1 05/22/2011 1655   CO2 23 05/22/2011 1655   BUN 16 05/22/2011 1655   CREATININE 0.58 05/22/2011 1655      Component Value Date/Time   CALCIUM 8.8 05/22/2011 1655    ALKPHOS 153 05/22/2011 1655   AST 22 05/22/2011 1655   ALT 18 05/22/2011 1655   BILITOT 0.3 05/22/2011 1655      Lab Results  Component Value Date   IRON 128 09/19/2014   TIBC 349 09/19/2014   FERRITIN 201 09/19/2014     ASSESSMENT AND PLAN:  Iron deficiency anemia Iron deficiency anemia 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  Labs most recently are WNL.    Labs in 8 weeks and 16 weeks: CBC diff, iron/TIBC, ferritin  Vit D level in 16 weeks.  Return in 4 months for follow-up; sooner if needed for labs.  THERAPY PLAN:  Will follow labs along and address iron deficiency as needed.   All questions were answered. The patient knows to call the clinic with any problems, questions or concerns. We can certainly see the patient much sooner if necessary.  Patient and plan discussed with Dr. Ancil Linsey and she is in agreement with the aforementioned.   This note is electronically signed by: Robynn Pane 09/21/2014 3:06 PM

## 2014-10-26 ENCOUNTER — Other Ambulatory Visit (HOSPITAL_COMMUNITY): Payer: Self-pay | Admitting: Obstetrics & Gynecology

## 2014-10-26 DIAGNOSIS — Z1231 Encounter for screening mammogram for malignant neoplasm of breast: Secondary | ICD-10-CM

## 2014-11-15 ENCOUNTER — Encounter (HOSPITAL_COMMUNITY): Payer: Managed Care, Other (non HMO) | Attending: Hematology & Oncology

## 2014-11-15 DIAGNOSIS — Z9884 Bariatric surgery status: Secondary | ICD-10-CM | POA: Insufficient documentation

## 2014-11-15 DIAGNOSIS — D509 Iron deficiency anemia, unspecified: Secondary | ICD-10-CM | POA: Diagnosis not present

## 2014-11-15 LAB — IRON AND TIBC
IRON: 103 ug/dL (ref 28–170)
SATURATION RATIOS: 28 % (ref 10.4–31.8)
TIBC: 367 ug/dL (ref 250–450)
UIBC: 264 ug/dL

## 2014-11-15 LAB — CBC WITH DIFFERENTIAL/PLATELET
BASOS ABS: 0 10*3/uL (ref 0.0–0.1)
BASOS PCT: 0 % (ref 0–1)
EOS ABS: 0.1 10*3/uL (ref 0.0–0.7)
Eosinophils Relative: 1 % (ref 0–5)
HCT: 45.5 % (ref 36.0–46.0)
HEMOGLOBIN: 15 g/dL (ref 12.0–15.0)
Lymphocytes Relative: 24 % (ref 12–46)
Lymphs Abs: 1.7 10*3/uL (ref 0.7–4.0)
MCH: 31.7 pg (ref 26.0–34.0)
MCHC: 33 g/dL (ref 30.0–36.0)
MCV: 96.2 fL (ref 78.0–100.0)
Monocytes Absolute: 0.5 10*3/uL (ref 0.1–1.0)
Monocytes Relative: 7 % (ref 3–12)
NEUTROS PCT: 68 % (ref 43–77)
Neutro Abs: 4.9 10*3/uL (ref 1.7–7.7)
Platelets: 253 10*3/uL (ref 150–400)
RBC: 4.73 MIL/uL (ref 3.87–5.11)
RDW: 12.5 % (ref 11.5–15.5)
WBC: 7.2 10*3/uL (ref 4.0–10.5)

## 2014-11-15 LAB — FERRITIN: FERRITIN: 206 ng/mL (ref 11–307)

## 2014-11-15 NOTE — Progress Notes (Signed)
LABS DRAWN

## 2014-11-19 ENCOUNTER — Ambulatory Visit (HOSPITAL_COMMUNITY)
Admission: RE | Admit: 2014-11-19 | Discharge: 2014-11-19 | Disposition: A | Discharge status: Home / Self Care | Payer: Managed Care, Other (non HMO) | Source: Ambulatory Visit | Attending: Obstetrics & Gynecology | Admitting: Obstetrics & Gynecology

## 2014-11-19 DIAGNOSIS — Z1231 Encounter for screening mammogram for malignant neoplasm of breast: Secondary | ICD-10-CM | POA: Diagnosis present

## 2014-11-21 ENCOUNTER — Other Ambulatory Visit: Payer: Self-pay | Admitting: Obstetrics & Gynecology

## 2014-11-21 DIAGNOSIS — R928 Other abnormal and inconclusive findings on diagnostic imaging of breast: Secondary | ICD-10-CM

## 2014-11-27 ENCOUNTER — Ambulatory Visit (HOSPITAL_COMMUNITY)
Admission: RE | Admit: 2014-11-27 | Discharge: 2014-11-27 | Disposition: A | Discharge status: Home / Self Care | Payer: Managed Care, Other (non HMO) | Source: Ambulatory Visit | Attending: Family Medicine | Admitting: Family Medicine

## 2014-11-27 ENCOUNTER — Other Ambulatory Visit (HOSPITAL_COMMUNITY): Payer: Self-pay | Admitting: Family Medicine

## 2014-11-27 ENCOUNTER — Ambulatory Visit (HOSPITAL_COMMUNITY)
Admission: RE | Admit: 2014-11-27 | Discharge: 2014-11-27 | Disposition: A | Discharge status: Home / Self Care | Payer: Managed Care, Other (non HMO) | Source: Ambulatory Visit | Attending: Obstetrics & Gynecology | Admitting: Obstetrics & Gynecology

## 2014-11-27 DIAGNOSIS — R928 Other abnormal and inconclusive findings on diagnostic imaging of breast: Secondary | ICD-10-CM | POA: Insufficient documentation

## 2014-11-27 DIAGNOSIS — N6001 Solitary cyst of right breast: Secondary | ICD-10-CM | POA: Diagnosis not present

## 2014-11-27 DIAGNOSIS — N6310 Unspecified lump in the right breast, unspecified quadrant: Secondary | ICD-10-CM

## 2014-11-28 ENCOUNTER — Other Ambulatory Visit: Payer: Self-pay | Admitting: Obstetrics and Gynecology

## 2014-11-29 LAB — CYTOLOGY - PAP

## 2015-01-02 ENCOUNTER — Encounter (HOSPITAL_COMMUNITY): Payer: Managed Care, Other (non HMO) | Attending: Hematology & Oncology

## 2015-01-02 DIAGNOSIS — Z9884 Bariatric surgery status: Secondary | ICD-10-CM

## 2015-01-02 DIAGNOSIS — D509 Iron deficiency anemia, unspecified: Secondary | ICD-10-CM | POA: Diagnosis not present

## 2015-01-02 LAB — CBC WITH DIFFERENTIAL/PLATELET
BASOS ABS: 0 10*3/uL (ref 0.0–0.1)
BASOS PCT: 0 %
EOS PCT: 2 %
Eosinophils Absolute: 0.1 10*3/uL (ref 0.0–0.7)
HCT: 43.6 % (ref 36.0–46.0)
Hemoglobin: 14.2 g/dL (ref 12.0–15.0)
LYMPHS PCT: 25 %
Lymphs Abs: 1.7 10*3/uL (ref 0.7–4.0)
MCH: 31.3 pg (ref 26.0–34.0)
MCHC: 32.6 g/dL (ref 30.0–36.0)
MCV: 96 fL (ref 78.0–100.0)
MONO ABS: 0.4 10*3/uL (ref 0.1–1.0)
MONOS PCT: 7 %
Neutro Abs: 4.5 10*3/uL (ref 1.7–7.7)
Neutrophils Relative %: 66 %
PLATELETS: 223 10*3/uL (ref 150–400)
RBC: 4.54 MIL/uL (ref 3.87–5.11)
RDW: 12.4 % (ref 11.5–15.5)
WBC: 6.8 10*3/uL (ref 4.0–10.5)

## 2015-01-02 LAB — IRON AND TIBC
Iron: 108 ug/dL (ref 28–170)
SATURATION RATIOS: 33 % — AB (ref 10.4–31.8)
TIBC: 323 ug/dL (ref 250–450)
UIBC: 215 ug/dL

## 2015-01-02 LAB — FERRITIN: Ferritin: 153 ng/mL (ref 11–307)

## 2015-01-02 NOTE — Progress Notes (Signed)
Labs drawn

## 2015-01-03 LAB — VITAMIN D 25 HYDROXY (VIT D DEFICIENCY, FRACTURES): Vit D, 25-Hydroxy: 35.3 ng/mL (ref 30.0–100.0)

## 2015-01-04 ENCOUNTER — Encounter (HOSPITAL_COMMUNITY): Payer: Self-pay | Admitting: Oncology

## 2015-01-04 ENCOUNTER — Encounter (HOSPITAL_BASED_OUTPATIENT_CLINIC_OR_DEPARTMENT_OTHER): Payer: Managed Care, Other (non HMO) | Admitting: Oncology

## 2015-01-04 DIAGNOSIS — Z9884 Bariatric surgery status: Secondary | ICD-10-CM

## 2015-01-04 DIAGNOSIS — Z9889 Other specified postprocedural states: Secondary | ICD-10-CM | POA: Diagnosis not present

## 2015-01-04 DIAGNOSIS — D509 Iron deficiency anemia, unspecified: Secondary | ICD-10-CM

## 2015-01-04 NOTE — Assessment & Plan Note (Signed)
Labs in 6 months: CBC diff, iron/TIBC, ferritin, B12, folate, Vitamin D, Copper level.

## 2015-01-04 NOTE — Assessment & Plan Note (Addendum)
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  Labs most recently are WNL.    Labs in 3 months: CBC diff, iron/TIBC, ferritin  Labs in 6 months: CBC diff, iron/TIBC, ferritin, B12, folate, Vitamin D, Copper level.  She does not need to restart iron supplementation given her bypass and unlikely ability to orally absorb Fe.  Return in 6 months for follow-up; sooner if needed for labs.

## 2015-01-04 NOTE — Progress Notes (Signed)
Kimberly Kilts, MD [place] [address]  Iron deficiency anemia - Plan: CBC with Differential, Iron and TIBC, Ferritin, CBC with Differential, Iron and TIBC, Ferritin  H/O gastric bypass - Plan: CBC with Differential, Vitamin B12, Folate, Copper, serum, Vitamin D 25 hydroxy  CURRENT THERAPY: S/P weekly IV Feraheme 510 mg x 5 (05/07/2014- 06/04/2014).  INTERVAL HISTORY: Kimberly [name] 59 y.o. 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   I personally reviewed and went over laboratory results with the patient.  The results are noted within this dictation.  Hgb and ferritin are WNL.  Vit D is WNL.  She denies any signs or symptoms of iron deficiency.  She denies any blood in stool and dark stools.   Past Medical History  Diagnosis Date  . Diabetes mellitus     resolved since gastric bypass in 2009  . Hypertension     resolved since gastric bypass in 2009  . Hypercholesterolemia     resolved since gastric bypass in 2009  . Obesity   . Depression   . RLS (restless legs syndrome) 05/02/2014  . Anemia   . 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     is allergic to kiwi extract.  Kimberly [name] does not currently have medications on file.  Past Surgical History  Procedure Laterality Date  . Gastric bypass  01/2008  . Incontinence surgery  2008  . Cholecystectomy  1986  . Endometrial ablation    . Colonoscopy  11/19/2011    Procedure: COLONOSCOPY;  Surgeon: Daneil Dolin, MD;  [facility];  Service: Endoscopy;  Laterality: N/A;  9:30    Denies any headaches, dizziness, double vision, fevers, chills, night sweats, nausea, vomiting, diarrhea, constipation, chest pain, heart palpitations, shortness of breath, blood in stool, black tarry  stool, urinary pain, urinary burning, urinary frequency, hematuria.   PHYSICAL EXAMINATION  ECOG PERFORMANCE STATUS: 0 - Asymptomatic  There were no vitals filed for this visit.  GENERAL:alert, no distress, well nourished, well developed, comfortable, cooperative, obese and smiling SKIN: skin color, texture, turgor are normal, no rashes or significant lesions HEAD: Normocephalic, No masses, lesions, tenderness or abnormalities EYES: normal, PERRLA, EOMI, Conjunctiva are pink and non-injected EARS: External ears normal OROPHARYNX:lips, buccal mucosa, and tongue normal and mucous membranes are moist  NECK: supple, no adenopathy, thyroid normal size, non-tender, without nodularity, no stridor, non-tender, trachea midline LYMPH:  no palpable lymphadenopathy BREAST:not examined LUNGS: clear to auscultation  HEART: regular rate & rhythm ABDOMEN:abdomen soft, obese and normal bowel sounds BACK: Back symmetric, no curvature., No CVA tenderness EXTREMITIES:less then 2 second capillary refill, no joint deformities, effusion, or inflammation, no edema, no skin discoloration, no clubbing, no cyanosis  NEURO: alert & oriented x 3 with fluent speech, no focal motor/sensory deficits, gait normal    LABORATORY DATA: CBC    Component Value Date/Time   WBC 6.8 01/02/2015 1037   RBC 4.54 01/02/2015 1037   HGB 14.2 01/02/2015 1037   HCT 43.6 01/02/2015 1037   HCT 36 05/22/2011 1654   PLT 223 01/02/2015 1037   MCV 96.0 01/02/2015 1037   MCH 31.3 01/02/2015 1037   MCHC 32.6 01/02/2015 1037   RDW 12.4 01/02/2015 1037   RDW 16.1 05/22/2011 1654   LYMPHSABS  1.7 01/02/2015 1037   MONOABS 0.4 01/02/2015 1037   EOSABS 0.1 01/02/2015 1037   BASOSABS 0.0 01/02/2015 1037      Chemistry      Component Value Date/Time   NA 141 05/22/2011 1655   K 4.1 05/22/2011 1655   CO2 23 05/22/2011 1655   BUN 16 05/22/2011 1655   CREATININE 0.58 05/22/2011 1655      Component Value Date/Time   CALCIUM 8.8  05/22/2011 1655   ALKPHOS 153 05/22/2011 1655   AST 22 05/22/2011 1655   ALT 18 05/22/2011 1655   BILITOT 0.3 05/22/2011 1655      Lab Results  Component Value Date   IRON 108 01/02/2015   TIBC 323 01/02/2015   FERRITIN 153 01/02/2015     ASSESSMENT AND PLAN:  Iron deficiency anemia 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  Labs most recently are WNL.    Labs in 3 months: CBC diff, iron/TIBC, ferritin  Labs in 6 months: CBC diff, iron/TIBC, ferritin, B12, folate, Vitamin D, Copper level.  She does not need to restart iron supplementation given her bypass and unlikely ability to orally absorb Fe.  Return in 6 months for follow-up; sooner if needed for labs.  H/O gastric bypass Labs in 6 months: CBC diff, iron/TIBC, ferritin, B12, folate, Vitamin D, Copper level.  THERAPY PLAN:  Will follow labs along and address iron deficiency as needed.   All questions were answered. The patient knows to call the clinic with any problems, questions or concerns. We can certainly see the patient much sooner if necessary.  Patient and plan discussed with Dr. Ancil Linsey and she is in agreement with the aforementioned.   This note is electronically signed by: Robynn Pane 01/04/2015 2:21 PM

## 2015-01-04 NOTE — Patient Instructions (Signed)
..  [place] at [facility] Discharge Instructions  RECOMMENDATIONS MADE BY THE CONSULTANT AND ANY TEST RESULTS WILL BE SENT TO YOUR REFERRING PHYSICIAN.  Labs in 3 months and 6 months Return in 6 months for follow up  You do not need to start PO iron  Thank you for choosing [place] at [facility] to provide your oncology and hematology care.  To afford each patient quality time with our provider, please arrive at least 15 minutes before your scheduled appointment time.    You need to re-schedule your appointment should you arrive 10 or more minutes late.  We strive to give you quality time with our providers, and arriving late affects you and other patients whose appointments are after yours.  Also, if you no show three or more times for appointments you may be dismissed from the clinic at the providers discretion.     Again, thank you for choosing [facility].  Our hope is that these requests will decrease the amount of time that you wait before being seen by our physicians.       _____________________________________________________________  Should you have questions after your visit to [facility], please contact our office at [phone] between the hours of 8:30 a.m. and 4:30 p.m.  Voicemails left after 4:30 p.m. will not be returned until the following business day.  For prescription refill requests, have your pharmacy contact our office.

## 2015-02-19 ENCOUNTER — Other Ambulatory Visit (HOSPITAL_COMMUNITY): Payer: Self-pay | Admitting: Oncology

## 2015-02-19 DIAGNOSIS — D509 Iron deficiency anemia, unspecified: Secondary | ICD-10-CM

## 2015-02-21 ENCOUNTER — Encounter (HOSPITAL_COMMUNITY): Payer: Managed Care, Other (non HMO) | Attending: Hematology & Oncology

## 2015-02-21 DIAGNOSIS — Z9884 Bariatric surgery status: Secondary | ICD-10-CM | POA: Diagnosis not present

## 2015-02-21 DIAGNOSIS — D509 Iron deficiency anemia, unspecified: Secondary | ICD-10-CM

## 2015-02-21 LAB — CBC WITH DIFFERENTIAL/PLATELET
BASOS PCT: 0 %
Basophils Absolute: 0 10*3/uL (ref 0.0–0.1)
EOS PCT: 1 %
Eosinophils Absolute: 0.1 10*3/uL (ref 0.0–0.7)
HEMATOCRIT: 43.8 % (ref 36.0–46.0)
Hemoglobin: 14.3 g/dL (ref 12.0–15.0)
LYMPHS PCT: 22 %
Lymphs Abs: 1.6 10*3/uL (ref 0.7–4.0)
MCH: 31.5 pg (ref 26.0–34.0)
MCHC: 32.6 g/dL (ref 30.0–36.0)
MCV: 96.5 fL (ref 78.0–100.0)
MONO ABS: 0.5 10*3/uL (ref 0.1–1.0)
MONOS PCT: 6 %
NEUTROS ABS: 5.3 10*3/uL (ref 1.7–7.7)
Neutrophils Relative %: 71 %
Platelets: 238 10*3/uL (ref 150–400)
RBC: 4.54 MIL/uL (ref 3.87–5.11)
RDW: 13.1 % (ref 11.5–15.5)
WBC: 7.5 10*3/uL (ref 4.0–10.5)

## 2015-02-21 LAB — FERRITIN: Ferritin: 178 ng/mL (ref 11–307)

## 2015-02-21 LAB — IRON AND TIBC
IRON: 101 ug/dL (ref 28–170)
SATURATION RATIOS: 28 % (ref 10.4–31.8)
TIBC: 363 ug/dL (ref 250–450)
UIBC: 262 ug/dL

## 2015-02-21 NOTE — Progress Notes (Signed)
LABS DRAWN

## 2015-04-03 ENCOUNTER — Encounter: Payer: Self-pay | Admitting: "Endocrinology

## 2015-04-03 ENCOUNTER — Ambulatory Visit (INDEPENDENT_AMBULATORY_CARE_PROVIDER_SITE_OTHER): Payer: 59 | Admitting: "Endocrinology

## 2015-04-03 VITALS — BP 128/77 | HR 68 | Ht 63.0 in | Wt 246.0 lb

## 2015-04-03 DIAGNOSIS — E6609 Other obesity due to excess calories: Secondary | ICD-10-CM

## 2015-04-03 DIAGNOSIS — E559 Vitamin D deficiency, unspecified: Secondary | ICD-10-CM | POA: Diagnosis not present

## 2015-04-03 DIAGNOSIS — E212 Other hyperparathyroidism: Secondary | ICD-10-CM | POA: Diagnosis not present

## 2015-04-03 MED ORDER — VITAMIN D (ERGOCALCIFEROL) 1.25 MG (50000 UNIT) PO CAPS: 50000.0000 [IU] | ORAL_CAPSULE | ORAL | Status: DC

## 2015-04-03 MED ORDER — CALCIUM 1200 1200-1000 MG-UNIT PO CHEW: 1.0000 | CHEWABLE_TABLET | Freq: Three times a day (TID) | ORAL | Status: AC

## 2015-04-03 NOTE — Progress Notes (Signed)
Subjective:    Patient ID: Kimberly [name], female    DOB: 1955/06/09, PCP [name], MD   Past Medical History  Diagnosis Date  . Diabetes mellitus     resolved since gastric bypass in 2009  . Hypertension     resolved since gastric bypass in 2009  . Hypercholesterolemia     resolved since gastric bypass in 2009  . Obesity   . Depression   . RLS (restless legs syndrome) 05/02/2014  . Anemia   . 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 Gastric bypass  01/2008  . Incontinence surgery  2008  . Cholecystectomy  1986  . Endometrial ablation    . Colonoscopy  11/19/2011    Procedure: COLONOSCOPY;  Surgeon: Daneil Dolin, MD;  [facility]: [facility];  Service: Endoscopy;  Laterality: N/A;  9:30   Social History   Social History  . Marital Status: Married    Spouse Name: N/A  . Number of Children: N/A  . Years of Education: N/A   Occupational History  .  [employer]    make cigarettes   Social History Main Topics  . Smoking status: Never Smoker   . Smokeless tobacco: None  . Alcohol Use: 0.0 oz/week    0 Standard drinks or equivalent per week     Comment: 2 time a month (wine)  . Drug Use: No  . Sexual Activity: Not Asked   Other Topics Concern  . None   Social History Narrative   Lives with husband in a 2 story home.  Has 2 sons.     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     Education: [employer]   Outpatient Encounter Prescriptions as of 04/03/2015  Medication Sig  . acetaminophen (TYLENOL) 500 MG tablet Take 1,000 mg by mouth every 6 (six) hours as needed. For pain  . buPROPion (WELLBUTRIN XL) 300 MG 24 hr tablet Take 300 mg by mouth daily.  . Calcium Carbonate-Vit D-Min (CALCIUM 1200) 1200-1000 MG-UNIT CHEW Chew 1 tablet by mouth 3 (three) times daily with meals.  . celecoxib (CELEBREX) 200 MG capsule Take 200 mg by mouth daily.   . Cholecalciferol (VITAMIN  D3) 10000 UNITS capsule Take 2,000 Units by mouth 2 (two) times daily.   . citalopram (CELEXA) 40 MG tablet Take 40 mg by mouth daily.  Marland Kitchen loratadine (CLARITIN) 10 MG tablet Take 10 mg by mouth daily.  . Multiple Vitamin (MULTIVITAMIN WITH MINERALS) TABS Take 1 tablet by mouth daily.  Marland Kitchen rOPINIRole (REQUIP) 2 MG tablet Take 0.5 tab at noon and 1 tab at 7pm.  . [DISCONTINUED] Calcium Carbonate-Vit D-Min (CALCIUM 1200 PO) Take 2,400 mg by mouth 2 (two) times daily.  . Biotin 5000 MCG TABS Take 5,000 mcg by mouth daily. Reported on 04/03/2015  . FeFum-FePoly-FA-B Cmp-C-Biot (FOLIVANE-PLUS) CAPS Take 1 capsule by mouth daily. Reported on 04/03/2015  . Methylcellulose, Laxative, (FIBER THERAPY) 500 MG TABS Take 500 mg by mouth daily. Reported on 04/03/2015  . Vitamin D, Ergocalciferol, (DRISDOL) 50000 units CAPS capsule Take 1 capsule (50,000 Units total) by mouth every 7 (seven) days.   No facility-administered encounter medications on file as of 04/03/2015.   ALLERGIES: Allergies  Allergen Reactions  . Kiwi Extract Swelling and Rash   VACCINATION STATUS: Immunization History  Administered Date(s) Administered  . Influenza-Unspecified 12/05/2013, 12/26/2014    HPI  60 yr  old female with medical hx as above. She is here to f/u for elevated PTH associated with low calcium and vitamin D deficiency. She was put on vitamin D and calcium supplement. She states she has not been consistent taking her calcium and vitamin D.  Her Calcium status low at 8.4 white PTH rises back to 220.  Her hx includes sleeve gastric bypass in 2009 followed by chronic requirement for vitamin D and calcium supplement. she denies hx of hypercalcemia. On two prior labs her calcium was found to be 8.3 and 8.4. Her repeat Calcium is  Low normal at 8.4 , and  her PTH is rising 2-20 from  137, has been as high as 231. her PTHrP was low at 13, along with 24 hour urine calcium low at 29, still low at 26. pt denies any  family  history  of parathyroid, adrenal, pituitary dysfunction. -She has had a negative parathyroid scan. she has had DXA last year in [place], denies hx of fractures, denies nephrolithiasis. she denies family hx of parathyroid dysfuntion nor calcium abnormality. Review of Systems  Constitutional: no weight gain/loss, no fatigue, no subjective hyperthermia/hypothermia Eyes: no blurry vision, no xerophthalmia ENT: no sore throat, no nodules palpated in throat, no dysphagia/odynophagia, no hoarseness Cardiovascular: no CP/SOB/palpitations/leg swelling Respiratory: no cough/SOB Gastrointestinal: no N/V/D/C Musculoskeletal: no muscle/joint aches Skin: no rashes Neurological: no tremors/numbness/tingling/dizziness Psychiatric: no depression/anxiety  Objective:    BP 128/77 mmHg  Pulse 68  Ht 5\' 3"  (1.6 m)  Wt 246 lb (111.585 kg)  BMI 43.59 kg/m2  SpO2 95%  Wt Readings from Last 3 Encounters:  04/03/15 246 lb (111.585 kg)  09/21/14 254 lb 12.8 oz (115.577 kg)  07/05/14 253 lb 9.6 oz (115.032 kg)    Physical Exam Constitutional: overweight, in NAD Eyes: PERRLA, EOMI, no exophthalmos ENT: moist mucous membranes, no thyromegaly, no cervical lymphadenopathy Cardiovascular: RRR, No MRG Respiratory: CTA B Gastrointestinal: abdomen soft, NT, ND, BS+ Musculoskeletal: no deformities, strength intact in all 4 Skin: moist, warm, no rashes Neurological: no tremor with outstretched hands, DTR normal in all 4   CMP     Component Value Date/Time   NA 141 05/22/2011 1655   K 4.1 05/22/2011 1655   CO2 23 05/22/2011 1655   BUN 16 05/22/2011 1655   CREATININE 0.58 05/22/2011 1655   CALCIUM 8.8 05/22/2011 1655   PROT 6.1 05/22/2011 1655   ALBUMIN 4.0 05/22/2011 1655   AST 22 05/22/2011 1655   ALT 18 05/22/2011 1655   ALKPHOS 153 05/22/2011 1655   BILITOT 0.3 05/22/2011 1655    On 03/19/2011 her PTH was 220 (normal 14-64), along with calcium of 8.4   vitamin D low normal at 34 24 hour urine  calcium low at 36  Assessment & Plan:   1. Other hyperparathyroidism ([place]) Her repeat labs show  mild hypocalcemia associated with low normal vitamin D . -This is possibly what's keeping the PTH from normalizing. -Her repeat  24 hour urine calcium levels were low at 36, prior to studies showed 29 and 30, against a diagnosis of primary hyperparathyroidism. Her parathyroid scan is negative for parathyroid adenoma. 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  -She never had hypercalcemia , in fact she remains on calcium supplement. I advised her to increase calcium carbonate 1200 mg 3 times a day before meals. - During her last visit her PTH has  improving significantly from 231 to 37  along with correction of vitamin D and calcium . Points to  the possibility of secondary hyperparathyroidism . she will not be sent to surgery for now.  2. Vitamin D deficiency -I added vitamin D 50,000 units weekly for the next 12 weeks.  3. Obesity due to excess calories -She is status post Roux-en-Y gastric bypass. Carotid management exercise regimen recommended to the patient.   - I advised patient to maintain close follow up with [name], MD for primary care needs. Follow up plan: Return in about 3 months (around 07/02/2015) for follow up with pre-visit labs.  Glade Lloyd, MD Phone: [phone]  Fax: [phone]   04/03/2015, 1:54 PM

## 2015-04-05 ENCOUNTER — Other Ambulatory Visit (HOSPITAL_COMMUNITY): Payer: Managed Care, Other (non HMO)

## 2015-05-14 ENCOUNTER — Encounter (HOSPITAL_COMMUNITY): Payer: Self-pay

## 2015-06-10 ENCOUNTER — Telehealth: Payer: Self-pay | Admitting: "Endocrinology

## 2015-06-10 NOTE — Telephone Encounter (Signed)
LM to cal back, we need to reschedule 07/05/15 - office is closed

## 2015-07-04 ENCOUNTER — Encounter (HOSPITAL_COMMUNITY): Payer: Self-pay | Admitting: Oncology

## 2015-07-04 ENCOUNTER — Encounter (HOSPITAL_COMMUNITY): Payer: 59 | Attending: Hematology & Oncology

## 2015-07-04 ENCOUNTER — Other Ambulatory Visit (HOSPITAL_COMMUNITY): Payer: Self-pay | Admitting: Oncology

## 2015-07-04 ENCOUNTER — Other Ambulatory Visit: Payer: Self-pay | Admitting: "Endocrinology

## 2015-07-04 DIAGNOSIS — E538 Deficiency of other specified B group vitamins: Secondary | ICD-10-CM

## 2015-07-04 DIAGNOSIS — Z9884 Bariatric surgery status: Secondary | ICD-10-CM

## 2015-07-04 DIAGNOSIS — Z9889 Other specified postprocedural states: Secondary | ICD-10-CM | POA: Insufficient documentation

## 2015-07-04 DIAGNOSIS — D509 Iron deficiency anemia, unspecified: Secondary | ICD-10-CM | POA: Diagnosis not present

## 2015-07-04 HISTORY — DX: Deficiency of other specified B group vitamins: E53.8

## 2015-07-04 LAB — VITAMIN B12: Vitamin B-12: 442 pg/mL (ref 180–914)

## 2015-07-04 LAB — CBC WITH DIFFERENTIAL/PLATELET
Basophils Absolute: 0 10*3/uL (ref 0.0–0.1)
Basophils Relative: 0 %
Eosinophils Absolute: 0.1 10*3/uL (ref 0.0–0.7)
Eosinophils Relative: 2 %
HEMATOCRIT: 43.8 % (ref 36.0–46.0)
Hemoglobin: 14.6 g/dL (ref 12.0–15.0)
LYMPHS PCT: 19 %
Lymphs Abs: 1.5 10*3/uL (ref 0.7–4.0)
MCH: 31.6 pg (ref 26.0–34.0)
MCHC: 33.3 g/dL (ref 30.0–36.0)
MCV: 94.8 fL (ref 78.0–100.0)
MONO ABS: 0.4 10*3/uL (ref 0.1–1.0)
MONOS PCT: 5 %
NEUTROS ABS: 5.9 10*3/uL (ref 1.7–7.7)
Neutrophils Relative %: 74 %
Platelets: 216 10*3/uL (ref 150–400)
RBC: 4.62 MIL/uL (ref 3.87–5.11)
RDW: 12.7 % (ref 11.5–15.5)
WBC: 7.9 10*3/uL (ref 4.0–10.5)

## 2015-07-04 LAB — IRON AND TIBC
Iron: 75 ug/dL (ref 28–170)
SATURATION RATIOS: 23 % (ref 10.4–31.8)
TIBC: 326 ug/dL (ref 250–450)
UIBC: 251 ug/dL

## 2015-07-04 LAB — FERRITIN: Ferritin: 151 ng/mL (ref 11–307)

## 2015-07-04 LAB — FOLATE: Folate: 4.2 ng/mL — ABNORMAL LOW (ref >5.9)

## 2015-07-04 MED ORDER — FOLIC ACID 1 MG PO TABS: 1.0000 mg | ORAL_TABLET | Freq: Every day | ORAL | Status: DC

## 2015-07-05 ENCOUNTER — Ambulatory Visit: Payer: 59 | Admitting: "Endocrinology

## 2015-07-05 LAB — PTH, INTACT AND CALCIUM
CALCIUM: 9.1 mg/dL (ref 8.4–10.5)
PTH: 170 pg/mL — AB (ref 14–64)

## 2015-07-05 LAB — VITAMIN D 25 HYDROXY (VIT D DEFICIENCY, FRACTURES)
VIT D 25 HYDROXY: 50 ng/mL (ref 30–100)
Vit D, 25-Hydroxy: 55.1 ng/mL (ref 30.0–100.0)

## 2015-07-06 LAB — COPPER, SERUM: COPPER: 109 ug/dL (ref 72–166)

## 2015-07-08 ENCOUNTER — Ambulatory Visit (HOSPITAL_COMMUNITY): Payer: Managed Care, Other (non HMO) | Admitting: Oncology

## 2015-07-08 ENCOUNTER — Other Ambulatory Visit (HOSPITAL_COMMUNITY): Payer: Managed Care, Other (non HMO)

## 2015-07-18 ENCOUNTER — Telehealth: Payer: Self-pay | Admitting: Neurology

## 2015-07-18 ENCOUNTER — Encounter: Payer: Self-pay | Admitting: "Endocrinology

## 2015-07-18 ENCOUNTER — Ambulatory Visit (INDEPENDENT_AMBULATORY_CARE_PROVIDER_SITE_OTHER): Payer: 59 | Admitting: "Endocrinology

## 2015-07-18 VITALS — BP 124/81 | HR 61 | Ht 63.0 in | Wt 226.0 lb

## 2015-07-18 DIAGNOSIS — E212 Other hyperparathyroidism: Secondary | ICD-10-CM

## 2015-07-18 DIAGNOSIS — E559 Vitamin D deficiency, unspecified: Secondary | ICD-10-CM

## 2015-07-18 MED ORDER — VITAMIN D (ERGOCALCIFEROL) 1.25 MG (50000 UNIT) PO CAPS: 50000.0000 [IU] | ORAL_CAPSULE | ORAL | Status: AC

## 2015-07-18 NOTE — Telephone Encounter (Signed)
PT called in regards to her medication/Dawn CB# [phone]

## 2015-07-18 NOTE — Telephone Encounter (Signed)
Patient called requesting refill on ropinirole.  She has not been seen in a year but says that she is doing fine and does not feel like she needs to be seen at this time.  Ok to refill?

## 2015-07-18 NOTE — Progress Notes (Signed)
Subjective:    Patient ID: Kimberly [name], female    DOB: Apr 29, 1955, PCP [name], MD   Past Medical History  Diagnosis Date  . Diabetes mellitus     resolved since gastric bypass in 2009  . Hypertension     resolved since gastric bypass in 2009  . Hypercholesterolemia     resolved since gastric bypass in 2009  . Obesity   . Depression   . RLS (restless legs syndrome) 05/02/2014  . Anemia   . 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 Folate deficiency 07/04/2015   Past Surgical History  Procedure Laterality Date  . Gastric bypass  01/2008  . Incontinence surgery  2008  . Cholecystectomy  1986  . Endometrial ablation    . Colonoscopy  11/19/2011    Procedure: COLONOSCOPY;  Surgeon: Daneil Dolin, MD;  [facility];  Service: Endoscopy;  Laterality: N/A;  9:30   Social History   Social History  . Marital Status: Married    Spouse Name: N/A  . Number of Children: N/A  . Years of Education: N/A   Occupational History  .  [employer]    make cigarettes   Social History Main Topics  . Smoking status: Never Smoker   . Smokeless tobacco: None  . Alcohol Use: 0.0 oz/week    0 Standard drinks or equivalent per week     Comment: 2 time a month (wine)  . Drug Use: No  . Sexual Activity: Not Asked   Other Topics Concern  . None   Social History Narrative   Lives with husband in a 2 story home.  Has 2 sons.     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     Education: [employer]   Outpatient Encounter Prescriptions as of 07/18/2015  Medication Sig  . Vitamin D, Ergocalciferol, (DRISDOL) 50000 units CAPS capsule Take 1 capsule (50,000 Units total) by mouth every 14 (fourteen) days.  . [DISCONTINUED] Vitamin D, Ergocalciferol, (DRISDOL) 50000 units CAPS capsule Take 50,000 Units by mouth every 14 (fourteen) days.  Marland Kitchen acetaminophen (TYLENOL) 500 MG tablet Take 1,000 mg by mouth every 6 (six) hours as needed. For pain   . Biotin 5000 MCG TABS Take 5,000 mcg by mouth daily. Reported on 04/03/2015  . buPROPion (WELLBUTRIN XL) 300 MG 24 hr tablet Take 300 mg by mouth daily.  . Calcium Carbonate-Vit D-Min (CALCIUM 1200) 1200-1000 MG-UNIT CHEW Chew 1 tablet by mouth 3 (three) times daily with meals.  . celecoxib (CELEBREX) 200 MG capsule Take 200 mg by mouth daily.   . Cholecalciferol (VITAMIN D3) 10000 UNITS capsule Take 2,000 Units by mouth 2 (two) times daily.   . citalopram (CELEXA) 40 MG tablet Take 40 mg by mouth daily.  Marland Kitchen FeFum-FePoly-FA-B Cmp-C-Biot (FOLIVANE-PLUS) CAPS Take 1 capsule by mouth daily. Reported on [id number]  . folic acid (FOLVITE) 1 MG tablet Take 1 tablet (1 mg total) by mouth daily.  Marland Kitchen loratadine (CLARITIN) 10 MG tablet Take 10 mg by mouth daily.  . Methylcellulose, Laxative, (FIBER THERAPY) 500 MG TABS Take 500 mg by mouth daily. Reported on 04/03/2015  . Multiple Vitamin (MULTIVITAMIN WITH MINERALS) TABS Take 1 tablet by mouth daily.  Marland Kitchen rOPINIRole (REQUIP) 2 MG tablet Take 0.5 tab at noon and 1 tab at 7pm.  . [DISCONTINUED] Vitamin D, Ergocalciferol, (DRISDOL) 50000 units CAPS capsule Take 1 capsule (50,000 Units total) by mouth every 7 (seven) days.  No facility-administered encounter medications on file as of 07/18/2015.   ALLERGIES: Allergies  Allergen Reactions  . Kiwi Extract Swelling and Rash   VACCINATION STATUS: Immunization History  Administered Date(s) Administered  . Influenza-Unspecified 12/05/2013, 12/26/2014    HPI  60 yr old female with medical hx as above. She is here to f/u for elevated PTH associated with low calcium and vitamin D deficiency. -With a diagnosis of secondary hyperparathyroidism, She was put on vitamin D and calcium supplement. She states she has  been consistent taking her calcium and vitamin D.  Her Calcium is now better at 9.1 and her PTH is dropping to 170 from a high of 231. -Her vitamin D is now replete at 50. Her hx includes sleeve gastric  bypass in 2009 followed by chronic requirement for vitamin D and calcium supplement. she denies hx of hypercalcemia. On two prior labs her calcium was found to be 8.3 and 8.4. her PTHrP was low at 13, along with 24 hour urine calcium low at 29, still low at 26. pt denies any  family history  of parathyroid, adrenal, pituitary dysfunction. -She has had a negative parathyroid scan. she has had DXA last year in [place] reportedly normal, denies hx of fractures, denies nephrolithiasis. she denies family hx of parathyroid dysfuntion nor calcium abnormality. Review of Systems  Constitutional: + weight loss of 20 pounds since last visit on weight watchers, no fatigue, no subjective hyperthermia/hypothermia Eyes: no blurry vision, no xerophthalmia ENT: no sore throat, no nodules palpated in throat, no dysphagia/odynophagia, no hoarseness Cardiovascular: no CP/SOB/palpitations/leg swelling Respiratory: no cough/SOB Gastrointestinal: no N/V/D/C Musculoskeletal: no muscle/joint aches Skin: no rashes Neurological: no tremors/numbness/tingling/dizziness Psychiatric: no depression/anxiety  Objective:    BP 124/81 mmHg  Pulse 61  Ht 5\' 3"  (1.6 m)  Wt 226 lb (102.513 kg)  BMI 40.04 kg/m2  SpO2 96%  Wt Readings from Last 3 Encounters:  07/18/15 226 lb (102.513 kg)  04/03/15 246 lb (111.585 kg)  09/21/14 254 lb 12.8 oz (115.577 kg)    Physical Exam Constitutional: overweight, in NAD Eyes: PERRLA, EOMI, no exophthalmos ENT: moist mucous membranes, no thyromegaly, no cervical lymphadenopathy Cardiovascular: RRR, No MRG Respiratory: CTA B Gastrointestinal: abdomen soft, NT, ND, BS+ Musculoskeletal: no deformities, strength intact in all 4 Skin: moist, warm, no rashes Neurological: no tremor with outstretched hands, DTR normal in all 4   CMP     Component Value Date/Time   NA 141 05/22/2011 1655   K 4.1 05/22/2011 1655   CO2 23 05/22/2011 1655   BUN 16 05/22/2011 1655   CREATININE 0.58  05/22/2011 1655   CALCIUM 9.1 07/04/2015 1128   CALCIUM 8.8 05/22/2011 1655   PROT 6.1 05/22/2011 1655   ALBUMIN 4.0 05/22/2011 1655   AST 22 05/22/2011 1655   ALT 18 05/22/2011 1655   ALKPHOS 153 05/22/2011 1655   BILITOT 0.3 05/22/2011 1655   On 07/04/2015 and her PTH was 170, vitamin D 50, calcium 9.1.  Assessment & Plan:   1.  hyperparathyroidism ([place]) -Likely secondary to chronic hypovitaminosis D. Her repeat labs show  normal calcium at 9.1 and repeat vitamin D associated with improving PTH to 170 from 231.   -Her  prior repeat  24 hour urine calcium levels were low at 36, prior two studies showed 29 and 30, against a diagnosis of primary hyperparathyroidism. Her parathyroid scan   has been negative for parathyroid adenoma.  -She never had hypercalcemia , in fact she remains on calcium supplement.  I advised her to  continue calcium carbonate 1200 mg 3 times a day with meals.   2. Vitamin D deficiency -I advised her to continue vitamin D 50,000 units every other week.   3. Obesity due to excess calories -She is status post Roux-en-Y gastric bypass. Carbs management , exercise regimen recommended to the patient.   - I advised patient to maintain close follow up with [name], MD for primary care needs. -She reports that she'll be moving out of state, I like to see her labs in 3 months- including PTH/calcium and vitamin D levels.  Follow up plan: Return in about 3 months (around 10/17/2015) for Hyperparathyroidism, Vitamin D deficiency.  Glade Lloyd, MD Phone: [phone]  Fax: [phone]   07/18/2015, 9:03 PM

## 2015-07-18 NOTE — Telephone Encounter (Signed)
OK to refill for 30-day supply, but she does need to be seen on an annual basis for refills.  Emily Massar K. Posey Pronto, DO

## 2015-07-19 ENCOUNTER — Other Ambulatory Visit: Payer: Self-pay | Admitting: *Deleted

## 2015-07-19 MED ORDER — ROPINIROLE HCL 2 MG PO TABS: ORAL_TABLET | ORAL | Status: AC

## 2015-07-19 NOTE — Telephone Encounter (Signed)
Rx sent.  Note sent for patient to set up an appointment.  She will need annual visits in order to continue with Rx.

## 2015-07-25 NOTE — Progress Notes (Signed)
[name], MD [place] [id number]  Iron deficiency anemia - Plan: CBC with Differential, Iron and TIBC, Ferritin, Comprehensive metabolic panel, CANCELED: CBC with Differential, CANCELED: Iron and TIBC, CANCELED: Ferritin, CANCELED: Basic metabolic panel  Folate deficiency - Plan: Folate, CANCELED: Folate  H/O gastric bypass - Plan: CBC with Differential, Comprehensive metabolic panel, CANCELED: CBC with Differential, CANCELED: Iron and TIBC, CANCELED: Ferritin, CANCELED: Basic metabolic panel, CANCELED: Vitamin B12, CANCELED: [place], serum, CANCELED: Vitamin D 25 hydroxy  CURRENT THERAPY: S/P weekly IV Feraheme 510 mg x 5 (05/07/2014- 06/04/2014).  INTERVAL HISTORY: Kimberly [name] 60 y.o. 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   I personally reviewed and went over laboratory results with the patient.  The results are noted within this dictation.  Hgb and ferritin are WNL.  Vit D is WNL.  Folate deficiency was noted and she was immediately started on folic acid 1 mg daily.  She denies any signs or symptoms of iron deficiency.  She denies any blood in stool and dark stools.   Past Medical History  Diagnosis Date  . Diabetes mellitus     resolved since gastric bypass in 2009  . Hypertension     resolved since gastric bypass in 2009  . Hypercholesterolemia     resolved since gastric bypass in 2009  . Obesity   . Depression   . RLS (restless legs syndrome) 05/02/2014  . Anemia   . 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 Folate deficiency 07/04/2015    has Encounter for screening colonoscopy; RLS (restless legs syndrome); Iron deficiency anemia; H/O gastric bypass; Other hyperparathyroidism ([place]); Vitamin D deficiency; Obesity due to excess calories; and Folate deficiency on her problem list.     is allergic to kiwi extract.  Ms. Temby does not currently have medications  on file.  Past Surgical History  Procedure Laterality Date  . Gastric bypass  01/2008  . Incontinence surgery  2008  . Cholecystectomy  1986  . Endometrial ablation    . Colonoscopy  11/19/2011    Procedure: COLONOSCOPY;  Surgeon: Daneil Dolin, MD;  Location[facility];  Service: Endoscopy;  Laterality: N/A;  9:30    Denies any headaches, dizziness, double vision, fevers, chills, night sweats, nausea, vomiting, diarrhea, constipation, chest pain, heart palpitations, shortness of breath, blood in stool, black tarry stool, urinary pain, urinary burning, urinary frequency, hematuria.   PHYSICAL EXAMINATION  ECOG PERFORMANCE STATUS: 0 - Asymptomatic  Filed Vitals:   07/26/15 0959  BP: 139/76  Pulse: 73  Temp: 98 F (36.7 C)  Resp: 18    GENERAL:alert, no distress, well nourished, well developed, comfortable, cooperative, obese and smiling, unaccompanied SKIN: skin color, texture, turgor are normal, no rashes or significant lesions HEAD: Normocephalic, No masses, lesions, tenderness or abnormalities EYES: normal, PERRLA, EOMI, Conjunctiva are pink and non-injected EARS: External ears normal OROPHARYNX:lips, buccal mucosa, and tongue normal and mucous membranes are moist  NECK: supple, no adenopathy, thyroid normal size, non-tender, without nodularity, no stridor, non-tender, trachea midline LYMPH:  no palpable lymphadenopathy BREAST:not examined LUNGS: clear to auscultation and percussion without wheezes, rales, or rhonchi. HEART: regular rate & rhythm without murmur, rub, gallop. Normal S1 and S2. ABDOMEN:abdomen soft, obese and normal bowel sounds BACK: Back symmetric, no curvature., No CVA tenderness EXTREMITIES:less then 2 second capillary refill, no joint deformities, effusion, or inflammation, no edema, no skin discoloration,  no clubbing, no cyanosis  NEURO: alert & oriented x 3 with fluent speech, no focal motor/sensory deficits, gait normal    LABORATORY  DATA: CBC    Component Value Date/Time   WBC 7.9 07/04/2015 1059   RBC 4.62 07/04/2015 1059   HGB 14.6 07/04/2015 1059   HCT 43.8 07/04/2015 1059   HCT 36 05/22/2011 1654   PLT 216 07/04/2015 1059   MCV 94.8 07/04/2015 1059   MCH 31.6 07/04/2015 1059   MCHC 33.3 07/04/2015 1059   RDW 12.7 07/04/2015 1059   RDW 16.1 05/22/2011 1654   LYMPHSABS 1.5 07/04/2015 1059   MONOABS 0.4 07/04/2015 1059   EOSABS 0.1 07/04/2015 1059   BASOSABS 0.0 07/04/2015 1059      Chemistry      Component Value Date/Time   NA 141 05/22/2011 1655   K 4.1 05/22/2011 1655   CO2 23 05/22/2011 1655   BUN 16 05/22/2011 1655   CREATININE 0.58 05/22/2011 1655      Component Value Date/Time   CALCIUM 9.1 07/04/2015 1128   CALCIUM 8.8 05/22/2011 1655   ALKPHOS 153 05/22/2011 1655   AST 22 05/22/2011 1655   ALT 18 05/22/2011 1655   BILITOT 0.3 05/22/2011 1655      Lab Results  Component Value Date   IRON 75 07/04/2015   TIBC 326 07/04/2015   FERRITIN 151 07/04/2015   Lab Results  Component Value Date   VITAMINB12 442 07/04/2015   Lab Results  Component Value Date   FOLATE 4.2* 07/04/2015    ASSESSMENT AND PLAN:  Iron deficiency anemia Iron deficiency anemia 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  Labs most recently are WNL.    Labs end of July 2017: CBC diff, CMET, iron/TIBC, ferritin  She is moving to [place], about 1 hour outside of [place], [place] on Aug 11th.  She was offered early retirement from [employer] with some incentive(s) and therefore she took advantage of the opportunity.  She has family in [place] and she and her husband are moving to be closer to them.    Since she is leaving, she will not have a return appointment.  We will release her from the clinic.  She will establish herself with a primary care provider in [place].  I have asked her to  sign a release of authorization when she establishes herself in [place] so her records can be sent to her healthcare provider.  Folate deficiency Folate deficiency noted on labs on 07/04/2015.  She was started on Folvite 1 mg on 07/04/2015.  H/O gastric bypass H/O gastric bypass.   THERAPY PLAN:  She will be released from the [facility] as she moves to [place] in August 2017.  We will check labs 1 last time prior to her departure and address any hematologic issues if they arise.  All questions were answered. The patient knows to call the clinic with any problems, questions or concerns. We can certainly see the patient much sooner if necessary.  Patient and plan discussed with Dr. Ancil Linsey and she is in agreement with the aforementioned.   This note is electronically signed by: Robynn Pane 07/26/2015 10:37 AM

## 2015-07-25 NOTE — Assessment & Plan Note (Addendum)
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  Labs most recently are WNL.    Labs end of July 2017: CBC diff, CMET, iron/TIBC, ferritin  She is moving to [place], about 1 hour outside of [place], [place] on Aug 11th.  She was offered early retirement from [employer] with some incentive(s) and therefore she took advantage of the opportunity.  She has family in [place] and she and her husband are moving to be closer to them.    Since she is leaving, she will not have a return appointment.  We will release her from the clinic.  She will establish herself with a primary care provider in [place].  I have asked her to sign a release of authorization when she establishes herself in [place] so her records can be sent to her healthcare provider.

## 2015-07-25 NOTE — Assessment & Plan Note (Addendum)
H/O gastric bypass.

## 2015-07-25 NOTE — Assessment & Plan Note (Signed)
Folate deficiency noted on labs on 07/04/2015.  She was started on Folvite 1 mg on 07/04/2015.

## 2015-07-26 ENCOUNTER — Encounter (HOSPITAL_COMMUNITY): Payer: Self-pay | Admitting: Oncology

## 2015-07-26 ENCOUNTER — Encounter (HOSPITAL_COMMUNITY): Payer: 59 | Attending: Hematology & Oncology | Admitting: Oncology

## 2015-07-26 VITALS — BP 139/76 | HR 73 | Temp 98.0°F | Resp 18 | Wt 224.0 lb

## 2015-07-26 DIAGNOSIS — D509 Iron deficiency anemia, unspecified: Secondary | ICD-10-CM | POA: Insufficient documentation

## 2015-07-26 DIAGNOSIS — E538 Deficiency of other specified B group vitamins: Secondary | ICD-10-CM | POA: Diagnosis not present

## 2015-07-26 DIAGNOSIS — K909 Intestinal malabsorption, unspecified: Secondary | ICD-10-CM | POA: Diagnosis not present

## 2015-07-26 DIAGNOSIS — Z9889 Other specified postprocedural states: Secondary | ICD-10-CM | POA: Insufficient documentation

## 2015-07-26 DIAGNOSIS — Z9884 Bariatric surgery status: Secondary | ICD-10-CM | POA: Diagnosis not present

## 2015-07-26 NOTE — Patient Instructions (Signed)
Kimberly [name] at [facility] Discharge Instructions  RECOMMENDATIONS MADE BY THE CONSULTANT AND ANY TEST RESULTS WILL BE SENT TO YOUR REFERRING PHYSICIAN.  Labs most recently are good.  Iron level were normal.  Your FOLATE level was low and therefore, we started you on FOLVITE 1 mg daily.  You should continue this daily.   Good luck on your move to [place].  I hope all is well.   We will perform labs in 3 months for a final check. We will release you from the clinic as a result.   Please call us if we can be of assistance to you.  CBC    Component Value Date/Time   WBC 7.9 07/04/2015 1059   RBC 4.62 07/04/2015 1059   HGB 14.6 07/04/2015 1059   HCT 43.8 07/04/2015 1059   HCT 36 05/22/2011 1654   PLT 216 07/04/2015 1059   MCV 94.8 07/04/2015 1059   MCH 31.6 07/04/2015 1059   MCHC 33.3 07/04/2015 1059   RDW 12.7 07/04/2015 1059   RDW 16.1 05/22/2011 1654   LYMPHSABS 1.5 07/04/2015 1059   MONOABS 0.4 07/04/2015 1059   EOSABS 0.1 07/04/2015 1059   BASOSABS 0.0 07/04/2015 1059      Chemistry      Component Value Date/Time   NA 141 05/22/2011 1655   K 4.1 05/22/2011 1655   CO2 23 05/22/2011 1655   BUN 16 05/22/2011 1655   CREATININE 0.58 05/22/2011 1655      Component Value Date/Time   CALCIUM 9.1 07/04/2015 1128   CALCIUM 8.8 05/22/2011 1655   ALKPHOS 153 05/22/2011 1655   AST 22 05/22/2011 1655   ALT 18 05/22/2011 1655   BILITOT 0.3 05/22/2011 1655     Lab Results  Component Value Date   IRON 75 07/04/2015   TIBC 326 07/04/2015   FERRITIN 151 07/04/2015   Lab Results  Component Value Date   VITAMINB12 442 07/04/2015   Lab Results  Component Value Date   FOLATE 4.2* 07/04/2015     Thank you for choosing [place] at [facility] to provide your oncology and hematology care.  To afford each patient quality time with our provider, please arrive at least 15 minutes before your scheduled appointment time.   Beginning  January 23rd 2017 lab work for the [facility] will be done in the  Main lab at [facility] on 1st floor. If you have a lab appointment with the [facility] please come in thru the  Main Entrance and check in at the main information desk  You need to re-schedule your appointment should you arrive 10 or more minutes late.  We strive to give you quality time with our providers, and arriving late affects you and other patients whose appointments are after yours.  Also, if you no show three or more times for appointments you may be dismissed from the clinic at the providers discretion.     Again, thank you for choosing [facility].  Our hope is that these requests will decrease the amount of time that you wait before being seen by our physicians.       _____________________________________________________________  Should you have questions after your visit to [facility], please contact our office at [phone] between the hours of 8:30 a.m. and 4:30 p.m.  Voicemails left after 4:30 p.m. will not be returned until the following business day.  For prescription refill requests, have your  pharmacy contact our office.         Resources For Cancer Patients and their Caregivers ? American Cancer Society: Can assist with transportation, wigs, general needs, runs Look Good Feel Better.        914-228-7433 ? Cancer Care: Provides financial assistance, online support groups, medication/co-pay assistance.  1-800-813-HOPE (515)115-4899) ? [place] Assists [place] cancer patients and their families through emotional , educational and financial support.  320-713-6625 ? [place] DSS Where to apply for food stamps, Medicaid and utility assistance. (540)868-7101 ? [facility]: Transportation to medical appointments. 857-378-1012 ? Social Security Administration: May apply for disability if have a Stage IV cancer. (418)173-9795  (401)867-0512 ? [employer], Disability and Transit Services: Assists with nutrition, care and transit needs. [phone]

## 2015-10-14 ENCOUNTER — Encounter (HOSPITAL_COMMUNITY): Payer: 59 | Attending: Hematology & Oncology

## 2015-10-14 DIAGNOSIS — Z9889 Other specified postprocedural states: Secondary | ICD-10-CM | POA: Insufficient documentation

## 2015-10-14 DIAGNOSIS — D509 Iron deficiency anemia, unspecified: Secondary | ICD-10-CM | POA: Diagnosis not present

## 2015-10-14 DIAGNOSIS — Z9884 Bariatric surgery status: Secondary | ICD-10-CM

## 2015-10-14 DIAGNOSIS — E538 Deficiency of other specified B group vitamins: Secondary | ICD-10-CM

## 2015-10-14 LAB — FOLATE: Folate: 3.8 ng/mL — ABNORMAL LOW (ref >5.9)

## 2015-10-17 ENCOUNTER — Other Ambulatory Visit: Payer: Self-pay | Admitting: "Endocrinology

## 2015-10-18 LAB — PTH, INTACT AND CALCIUM
Calcium: 8.9 mg/dL (ref 8.4–10.5)
PTH: 138 pg/mL — ABNORMAL HIGH (ref 14–64)

## 2015-10-18 LAB — VITAMIN D 25 HYDROXY (VIT D DEFICIENCY, FRACTURES): Vit D, 25-Hydroxy: 47 ng/mL (ref 30–100)

## 2015-10-22 ENCOUNTER — Telehealth (HOSPITAL_COMMUNITY): Payer: Self-pay | Admitting: *Deleted

## 2015-10-22 DIAGNOSIS — E559 Vitamin D deficiency, unspecified: Secondary | ICD-10-CM

## 2015-10-22 NOTE — Telephone Encounter (Signed)
Returned patient phone call and ordered labs that she wanted before she moves to another state. Lab results given as well.

## 2015-10-23 ENCOUNTER — Encounter (HOSPITAL_COMMUNITY): Payer: 59 | Attending: Hematology & Oncology

## 2015-10-23 DIAGNOSIS — E559 Vitamin D deficiency, unspecified: Secondary | ICD-10-CM | POA: Diagnosis not present

## 2015-10-23 LAB — COMPREHENSIVE METABOLIC PANEL
ALT: 25 U/L (ref 14–54)
AST: 29 U/L (ref 15–41)
Albumin: 4.4 g/dL (ref 3.5–5.0)
Alkaline Phosphatase: 134 U/L — ABNORMAL HIGH (ref 38–126)
Anion gap: 4 — ABNORMAL LOW (ref 5–15)
BILIRUBIN TOTAL: 0.5 mg/dL (ref 0.3–1.2)
BUN: 15 mg/dL (ref 6–20)
CHLORIDE: 109 mmol/L (ref 101–111)
CO2: 25 mmol/L (ref 22–32)
CREATININE: 0.62 mg/dL (ref 0.44–1.00)
Calcium: 8.8 mg/dL — ABNORMAL LOW (ref 8.9–10.3)
Glucose, Bld: 92 mg/dL (ref 65–99)
POTASSIUM: 4.2 mmol/L (ref 3.5–5.1)
Sodium: 138 mmol/L (ref 135–145)
TOTAL PROTEIN: 7.3 g/dL (ref 6.5–8.1)

## 2015-10-23 LAB — CBC WITH DIFFERENTIAL/PLATELET
Basophils Absolute: 0 10*3/uL (ref 0.0–0.1)
Basophils Relative: 0 %
EOS PCT: 1 %
Eosinophils Absolute: 0.1 10*3/uL (ref 0.0–0.7)
HCT: 44.5 % (ref 36.0–46.0)
Hemoglobin: 14.5 g/dL (ref 12.0–15.0)
Lymphocytes Relative: 30 %
Lymphs Abs: 2.1 10*3/uL (ref 0.7–4.0)
MCH: 30.5 pg (ref 26.0–34.0)
MCHC: 32.6 g/dL (ref 30.0–36.0)
MCV: 93.7 fL (ref 78.0–100.0)
MONO ABS: 0.4 10*3/uL (ref 0.1–1.0)
Monocytes Relative: 5 %
Neutro Abs: 4.4 10*3/uL (ref 1.7–7.7)
Neutrophils Relative %: 64 %
PLATELETS: 235 10*3/uL (ref 150–400)
RBC: 4.75 MIL/uL (ref 3.87–5.11)
RDW: 12.4 % (ref 11.5–15.5)
WBC: 7 10*3/uL (ref 4.0–10.5)

## 2015-10-24 LAB — IRON AND TIBC
Iron: 87 ug/dL (ref 28–170)
Saturation Ratios: 26 % (ref 10.4–31.8)
TIBC: 337 ug/dL (ref 250–450)
UIBC: 250 ug/dL

## 2015-10-24 LAB — VITAMIN B12: Vitamin B-12: 473 pg/mL (ref 180–914)

## 2015-10-24 LAB — FERRITIN: Ferritin: 187 ng/mL (ref 11–307)

## 2015-10-25 LAB — COPPER, SERUM: COPPER: 94 ug/dL (ref 72–166)

## 2015-12-31 ENCOUNTER — Other Ambulatory Visit (HOSPITAL_COMMUNITY): Payer: Self-pay | Admitting: Oncology

## 2015-12-31 DIAGNOSIS — E538 Deficiency of other specified B group vitamins: Secondary | ICD-10-CM

## 2015-12-31 DIAGNOSIS — D500 Iron deficiency anemia secondary to blood loss (chronic): Secondary | ICD-10-CM

## 2016-01-01 ENCOUNTER — Encounter (HOSPITAL_COMMUNITY): Payer: 59 | Attending: Hematology & Oncology

## 2016-01-01 DIAGNOSIS — D500 Iron deficiency anemia secondary to blood loss (chronic): Secondary | ICD-10-CM | POA: Diagnosis not present

## 2016-01-01 DIAGNOSIS — E538 Deficiency of other specified B group vitamins: Secondary | ICD-10-CM | POA: Insufficient documentation

## 2016-01-01 LAB — CBC WITH DIFFERENTIAL/PLATELET
Basophils Absolute: 0 10*3/uL (ref 0.0–0.1)
Basophils Relative: 1 %
EOS ABS: 0.1 10*3/uL (ref 0.0–0.7)
EOS PCT: 2 %
HCT: 41.6 % (ref 36.0–46.0)
Hemoglobin: 13.6 g/dL (ref 12.0–15.0)
LYMPHS ABS: 1.4 10*3/uL (ref 0.7–4.0)
LYMPHS PCT: 24 %
MCH: 30.8 pg (ref 26.0–34.0)
MCHC: 32.7 g/dL (ref 30.0–36.0)
MCV: 94.3 fL (ref 78.0–100.0)
MONO ABS: 0.4 10*3/uL (ref 0.1–1.0)
Monocytes Relative: 6 %
Neutro Abs: 3.9 10*3/uL (ref 1.7–7.7)
Neutrophils Relative %: 67 %
PLATELETS: 210 10*3/uL (ref 150–400)
RBC: 4.41 MIL/uL (ref 3.87–5.11)
RDW: 12.7 % (ref 11.5–15.5)
WBC: 5.8 10*3/uL (ref 4.0–10.5)

## 2016-01-01 LAB — RENAL FUNCTION PANEL
ANION GAP: 7 (ref 5–15)
Albumin: 3.6 g/dL (ref 3.5–5.0)
BUN: 15 mg/dL (ref 6–20)
CALCIUM: 8.4 mg/dL — AB (ref 8.9–10.3)
CHLORIDE: 109 mmol/L (ref 101–111)
CO2: 23 mmol/L (ref 22–32)
Creatinine, Ser: 0.67 mg/dL (ref 0.44–1.00)
GFR calc non Af Amer: >60 mL/min (ref >60)
Glucose, Bld: 178 mg/dL — ABNORMAL HIGH (ref 65–99)
POTASSIUM: 3.7 mmol/L (ref 3.5–5.1)
Phosphorus: 2.5 mg/dL (ref 2.5–4.6)
Sodium: 139 mmol/L (ref 135–145)

## 2016-01-01 LAB — IRON AND TIBC
Iron: 50 ug/dL (ref 28–170)
Saturation Ratios: 15 % (ref 10.4–31.8)
TIBC: 328 ug/dL (ref 250–450)
UIBC: 278 ug/dL

## 2016-01-01 LAB — FOLATE: Folate: 8.7 ng/mL (ref >5.9)

## 2016-01-01 LAB — FERRITIN: Ferritin: 126 ng/mL (ref 11–307)

## 2016-01-29 ENCOUNTER — Other Ambulatory Visit (HOSPITAL_COMMUNITY): Payer: Self-pay

## 2016-01-29 DIAGNOSIS — E538 Deficiency of other specified B group vitamins: Secondary | ICD-10-CM

## 2016-01-29 MED ORDER — FOLIC ACID 1 MG PO TABS: 1.0000 mg | ORAL_TABLET | Freq: Every day | ORAL | 2 refills | Status: DC

## 2016-11-03 ENCOUNTER — Other Ambulatory Visit (HOSPITAL_COMMUNITY): Payer: Self-pay | Admitting: Oncology

## 2016-11-03 DIAGNOSIS — E538 Deficiency of other specified B group vitamins: Secondary | ICD-10-CM

## 2016-11-04 ENCOUNTER — Encounter: Payer: Self-pay | Admitting: Internal Medicine
